# Patient Record
Sex: Female | Born: 1995 | Race: Black or African American | Hispanic: No | Marital: Single | Smoking: Former smoker
Health system: Southern US, Community
[De-identification: ages and names within clinical notes are randomized; demographics above are authoritative.]

## PROBLEM LIST (undated history)

## (undated) ENCOUNTER — Ambulatory Visit: Admission: EM | Payer: Medicaid Other

## (undated) DIAGNOSIS — A749 Chlamydial infection, unspecified: Secondary | ICD-10-CM

## (undated) DIAGNOSIS — S93409A Sprain of unspecified ligament of unspecified ankle, initial encounter: Secondary | ICD-10-CM

## (undated) DIAGNOSIS — B009 Herpesviral infection, unspecified: Secondary | ICD-10-CM

## (undated) DIAGNOSIS — A549 Gonococcal infection, unspecified: Secondary | ICD-10-CM

## (undated) HISTORY — PX: NO PAST SURGERIES: SHX2092

## (undated) HISTORY — DX: Chlamydial infection, unspecified: A74.9

## (undated) HISTORY — DX: Herpesviral infection, unspecified: B00.9

## (undated) HISTORY — DX: Sprain of unspecified ligament of unspecified ankle, initial encounter: S93.409A

## (undated) HISTORY — DX: Gonococcal infection, unspecified: A54.9

---
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
## 2016-03-01 ENCOUNTER — Other Ambulatory Visit: Payer: Self-pay | Admitting: Obstetrics and Gynecology

## 2016-03-02 DIAGNOSIS — A749 Chlamydial infection, unspecified: Secondary | ICD-10-CM

## 2016-03-02 HISTORY — DX: Chlamydial infection, unspecified: A74.9

## 2016-03-03 NOTE — Telephone Encounter (Signed)
She is supposed to come in for a pill check. Please schedule, will send in a one month script.

## 2016-03-03 NOTE — Telephone Encounter (Signed)
No VM set up. Will try later. 03/03/16 -sco

## 2016-03-03 NOTE — Telephone Encounter (Signed)
Patient scheduled for pill check up on 03/11/16. -sco

## 2016-03-03 NOTE — Telephone Encounter (Signed)
Medication refill request: Norethindrone Acetate-Ethinyl Last AEX:  12/09/15 BS Next AEX: 03/11/16 BS Last MMG (if hormonal medication request): n/a Refill authorized: 12/09/15 #1 3R. Please advise. Thank you.   Routing to JJ since BS is out of office today.

## 2016-03-11 ENCOUNTER — Encounter: Payer: Self-pay | Admitting: Obstetrics and Gynecology

## 2016-03-11 ENCOUNTER — Ambulatory Visit (INDEPENDENT_AMBULATORY_CARE_PROVIDER_SITE_OTHER): Payer: BLUE CROSS/BLUE SHIELD | Admitting: Obstetrics and Gynecology

## 2016-03-11 VITALS — BP 120/80 | HR 78 | Resp 14 | Ht 63.5 in | Wt 184.0 lb

## 2016-03-11 DIAGNOSIS — Z113 Encounter for screening for infections with a predominantly sexual mode of transmission: Secondary | ICD-10-CM

## 2016-03-11 DIAGNOSIS — N926 Irregular menstruation, unspecified: Secondary | ICD-10-CM

## 2016-03-11 DIAGNOSIS — Z3009 Encounter for other general counseling and advice on contraception: Secondary | ICD-10-CM | POA: Diagnosis not present

## 2016-03-11 LAB — POCT URINE PREGNANCY: PREG TEST UR: NEGATIVE

## 2016-03-11 LAB — HEPATITIS C ANTIBODY: HCV AB: NEGATIVE

## 2016-03-11 MED ORDER — NORETHINDRONE ACET-ETHINYL EST 1.5-30 MG-MCG PO TABS: 1.0000 | ORAL_TABLET | Freq: Every day | ORAL | 8 refills | Status: DC

## 2016-03-11 NOTE — Progress Notes (Signed)
GYNECOLOGY  VISIT   HPI: 21 y.o.   Single  African American  female   [id number] with No LMP recorded. Patient is not currently having periods (Reason: Oral contraceptives).   here for 3 month f/u of OCP's. Patient requests STD testing due to having a new partner.      Taking pills continuously since October 2017. Has taken some pills late, within an hour. Started bleeding 02/23/16 - and continuing until now.  Bleeds some days yes and some days no.  Spotting only. Some period cramping.  Happy with this overall.  Considered IUD.  Taking semester off.  Working at [employer].  UPT - neg.  GYNECOLOGIC HISTORY: No LMP recorded. Patient is not currently having periods (Reason: Oral contraceptives). Contraception:  OCP Menopausal hormone therapy:  None  Last mammogram:  None Last pap smear:  N/a         OB History    Gravida Para Term Preterm AB Living   0 0 0 0 0 0   SAB TAB Ectopic Multiple Live Births   0 0 0 0 0         There are no active problems to display for this patient.   Past Medical History:  Diagnosis Date  . Ankle sprain 08/2015    No past surgical history on file.  Current Outpatient Prescriptions  Medication Sig Dispense Refill  . JUNEL 1.5/30 1.5-30 MG-MCG tablet TAKE 1 TABLET BY MOUTH DAILY. 21 tablet 0   No current facility-administered medications for this visit.      ALLERGIES: Patient has no known allergies.  Family History  Problem Relation Age of Onset  . Hypertension Mother   . Breast cancer Paternal Grandmother     Social History   Social History  . Marital status: Single    Spouse name: N/A  . Number of children: N/A  . Years of education: N/A   Occupational History  . Not on file.   Social History Main Topics  . Smoking status: Never Smoker  . Smokeless tobacco: Never Used  . Alcohol use No  . Drug use: No  . Sexual activity: Yes     Comment: withdrawal    Other Topics Concern  . Not on file   Social History  Narrative  . No narrative on file    ROS:  Pertinent items are noted in HPI.  PHYSICAL EXAMINATION:    BP 120/80 (BP Location: Right Arm, Patient Position: Sitting, Cuff Size: Normal)   Pulse 78   Resp 14   Ht 5' 3.5" (1.613 m)   Wt 184 lb (83.5 kg)   BMI 32.08 kg/m     General appearance: alert, cooperative and appears stated age  Pelvic: External genitalia:  no lesions              Urethra:  normal appearing urethra with no masses, tenderness or lesions              Bartholins and Skenes: normal                 Vagina: normal appearing vagina with normal color and discharge, no lesions.  Light bleeding noted.              Cervix: no lesions                Bimanual Exam:  Uterus:  normal size, contour, position, consistency, mobility, non-tender  Adnexa: no mass, fullness, tenderness         Chaperone was present for exam.  ASSESSMENT  Contraception monitoring.  Irregular menses on continuous OCPs. Desire for STD screening.   PLAN  Will take combined OCPs now for only 21 days per month and be pill free for the "final week of the pack." If this does not normalize cycles, will consider a different OCP or NuvaRing.  We discussed IUDs briefly and she will not pursue this at this time. STD screening today including Affirm.  Discussed importance of condom use. Follow up for annual exam and prn.    An After Visit Summary was printed and given to the patient.  _15_____ minutes face to face time of which over 50% was spent in counseling.

## 2016-03-12 ENCOUNTER — Telehealth: Payer: Self-pay | Admitting: Obstetrics and Gynecology

## 2016-03-12 LAB — STD PANEL
HEP B S AG: NEGATIVE
HIV: NONREACTIVE

## 2016-03-12 LAB — GC/CHLAMYDIA PROBE AMP
CT Probe RNA: DETECTED — AB
GC PROBE AMP APTIMA: NOT DETECTED

## 2016-03-12 LAB — WET PREP BY MOLECULAR PROBE
Candida species: NEGATIVE
Gardnerella vaginalis: POSITIVE — AB
TRICHOMONAS VAG: NEGATIVE

## 2016-03-12 NOTE — Telephone Encounter (Signed)
Phone call attempt to patient by cell phone.  Voice mail not set up so unable to leave message.  I will release results to the patient through My Chart and ask her to return a call to the office for her treatment plan.  Patient had a positive chlamydia test and bacterial vaginosis on Affirm.  She will need azithromycin 1 gram orally and Flagyl 500 mg po bid for 7 days.   Partner(s) will need to be treated and patient will need to abstain for one week from intercourse.  ETOH precautions.   The chlamydia infection may also caused her abnormal vaginal bleeding profile.  Chlamydia infection will need to be reported the health dept.   She will need to return in 3 months.

## 2016-03-13 MED ORDER — METRONIDAZOLE 500 MG PO TABS: 500.0000 mg | ORAL_TABLET | Freq: Two times a day (BID) | ORAL | 0 refills | Status: DC

## 2016-03-13 MED ORDER — AZITHROMYCIN 1 G PO PACK: 1.0000 | PACK | Freq: Once | ORAL | 0 refills | Status: AC

## 2016-03-13 NOTE — Telephone Encounter (Signed)
Attempted to reach patient at number provided [phone], okay per ROI. There was no answer and recording states "The person you have reached has a voicemail box that has not been set up yet."

## 2016-03-13 NOTE — Telephone Encounter (Signed)
Spoke with patient. Advised of results and message as seen below from Dr.Silva. Patient is agreeable and verbalizes understanding. Aware she will need to abstain from intercourse until she and her partner have received treatment and for 1 week following. Will need to use condoms for protection against STD's with intercourse. Zithromax 1 gram take 1 packet once #1 0RF sent to pharmacy on file. Health department confidential communicable disease report completed and [facility] at [phone]. Flagyl 500 mg BID x 7 days #14 0RF sent to pharmacy on file. ETOH precautions given. 3 month recheck scheduled for 06/17/2016 at 9:30 am with Dr.Silva. Patient is agreeable to date and time.  Routing to provider for final review. Patient agreeable to disposition. Will close encounter.

## 2016-03-22 ENCOUNTER — Other Ambulatory Visit: Payer: Self-pay | Admitting: Obstetrics and Gynecology

## 2016-03-23 NOTE — Telephone Encounter (Signed)
Medication refill request: OCP Last AEX:  12/09/15 Dr. Edward JollySilva  Next AEX: 01/27/17  Last MMG (if hormonal medication request): None [facility]

## 2016-05-31 DIAGNOSIS — J019 Acute sinusitis, unspecified: Secondary | ICD-10-CM | POA: Diagnosis not present

## 2016-06-15 NOTE — Progress Notes (Signed)
GYNECOLOGY  VISIT   HPI: 21 y.o.   Single  African American  female   G0P0000 with Patient's last menstrual period was 04/08/2016.   here for 3 month follow up. Patient states that she has not had a menstrual cycle since the beginning of February. Would like UPT and to discuss other birth control options. Has not been consistent with OCP. Also needs GC/CT for Chlamydia test of cure.  Positive Chlamydia on 03/11/16 treated with Azithromycin.  Last birth control pill was 2 weeks ago.  Had a negative UPT at home.   Has a steady partner.  Midline cramping.  No bleeding.   GYNECOLOGIC HISTORY: Patient's last menstrual period was 04/08/2016. Contraception:  OCPs--Junel 1.5/30 Menopausal hormone therapy:  n/a Last mammogram:  n/a Last pap smear:   n/a        OB History    Gravida Para Term Preterm AB Living   0 0 0 0 0 0   SAB TAB Ectopic Multiple Live Births   0 0 0 0 0         There are no active problems to display for this patient.   Past Medical History:  Diagnosis Date  . Ankle sprain 08/2015  . Chlamydia 03/2016    History reviewed. No pertinent surgical history.  Current Outpatient Prescriptions  Medication Sig Dispense Refill  . Norethindrone Acetate-Ethinyl Estradiol (JUNEL 1.5/30) 1.5-30 MG-MCG tablet Take 1 tablet by mouth daily. 21 tablet 8   No current facility-administered medications for this visit.      ALLERGIES: Patient has no known allergies.  Family History  Problem Relation Age of Onset  . Hypertension Mother   . Breast cancer Paternal Grandmother     Social History   Social History  . Marital status: Single    Spouse name: N/A  . Number of children: N/A  . Years of education: N/A   Occupational History  . Not on file.   Social History Main Topics  . Smoking status: Never Smoker  . Smokeless tobacco: Never Used  . Alcohol use No  . Drug use: No  . Sexual activity: Yes    Birth control/ protection: Pill     Comment: withdrawal     Other Topics Concern  . Not on file   Social History Narrative  . No narrative on file    ROS:  Pertinent items are noted in HPI.  PHYSICAL EXAMINATION:    BP 124/66 (BP Location: Right Arm, Patient Position: Sitting, Cuff Size: Normal)   Pulse 84   Resp 16   Wt 189 lb (85.7 kg)   LMP 04/08/2016   BMI 32.95 kg/m     General appearance: alert, cooperative and appears stated age.  Tearful.   Pelvic: External genitalia:  no lesions              Urethra:  normal appearing urethra with no masses, tenderness or lesions              Bartholins and Skenes: normal                 Vagina: normal appearing vagina with normal color and discharge, no lesions              Cervix: no lesions                Bimanual Exam:  Uterus:   6 week size.              Adnexa: no  mass, fullness, tenderness             Chaperone was present for exam.  ASSESSMENT  Amenorrhea.  Early pregnancy. Hx chlamydia.   PLAN  GC/CT performed from urine.  Discussed early pregnancy. Start PNV.  Patient would like to return within the next week with her partner for an office visit.   An After Visit Summary was printed and given to the patient.  __15____ minutes face to face time of which over 50% was spent in counseling.

## 2016-06-17 ENCOUNTER — Ambulatory Visit (INDEPENDENT_AMBULATORY_CARE_PROVIDER_SITE_OTHER): Payer: BLUE CROSS/BLUE SHIELD | Admitting: Obstetrics and Gynecology

## 2016-06-17 ENCOUNTER — Encounter: Payer: Self-pay | Admitting: Obstetrics and Gynecology

## 2016-06-17 VITALS — BP 124/66 | HR 84 | Resp 16 | Wt 189.0 lb

## 2016-06-17 DIAGNOSIS — Z8619 Personal history of other infectious and parasitic diseases: Secondary | ICD-10-CM | POA: Diagnosis not present

## 2016-06-17 DIAGNOSIS — N912 Amenorrhea, unspecified: Secondary | ICD-10-CM | POA: Diagnosis not present

## 2016-06-17 LAB — POCT URINE PREGNANCY: Preg Test, Ur: POSITIVE — AB

## 2016-06-18 ENCOUNTER — Telehealth: Payer: Self-pay

## 2016-06-18 DIAGNOSIS — Z3201 Encounter for pregnancy test, result positive: Secondary | ICD-10-CM

## 2016-06-18 DIAGNOSIS — N912 Amenorrhea, unspecified: Secondary | ICD-10-CM

## 2016-06-18 LAB — GC/CHLAMYDIA PROBE AMP
CT Probe RNA: NOT DETECTED
GC PROBE AMP APTIMA: NOT DETECTED

## 2016-06-18 NOTE — Telephone Encounter (Signed)
-----   Message from Patton Salles, MD sent at 06/18/2016 10:09 AM EDT ----- Please report results to patient.  Follow up GC/CT testing is negative for infection.  Patient was treated for chlamydia earlier this year.   Patient was diagnosed with pregnancy yesterday at her recheck appointment, and she plans to return to discuss this more next week.  Her history of chlamydia does increase the risk of ectopic pregnancy, which is pregnancy outside the uterus.  This usually occurs in a fallopian tube if an ectopic were to occur.  Her uterus felt like it was already growing, so I think this is unlikely to be the case now.  I need her to report if she is having any vaginal spotting or pain beyond mild midline cramping.  I do recommend a pelvic ultrasound with me next week to confirm pregnancy status.  Please send to precert.  Cc - Claudette Laws

## 2016-06-19 ENCOUNTER — Telehealth: Payer: Self-pay | Admitting: Obstetrics and Gynecology

## 2016-06-19 NOTE — Telephone Encounter (Signed)
Spoke with patient. Advised of results as seen below from Dr.Silva. Reviewed risk of ectopic pregnancy with history of chlamydia. Advised of importance of follow up care and monitoring for any vaginal spotting or pain beyond mild midline cramping. Patient verbalizes understanding. OB transvaginal ultrasound scheduled for 06/25/2016 at 10 am with 10:30 am consult with Dr.Silva. Patient is agreeable to date and time. Order placed for precert.  Routing to provider for final review. Patient agreeable to disposition. Will close encounter.

## 2016-06-19 NOTE — Telephone Encounter (Signed)
Called patient to review benefits for a scheduled ultrasound. Unable to leave voicemail, due to voicemail not being set up.

## 2016-06-25 ENCOUNTER — Encounter: Payer: Self-pay | Admitting: Obstetrics and Gynecology

## 2016-06-25 ENCOUNTER — Ambulatory Visit (INDEPENDENT_AMBULATORY_CARE_PROVIDER_SITE_OTHER): Payer: BLUE CROSS/BLUE SHIELD | Admitting: Obstetrics and Gynecology

## 2016-06-25 ENCOUNTER — Ambulatory Visit: Payer: BLUE CROSS/BLUE SHIELD | Admitting: Obstetrics and Gynecology

## 2016-06-25 ENCOUNTER — Ambulatory Visit (INDEPENDENT_AMBULATORY_CARE_PROVIDER_SITE_OTHER): Payer: BLUE CROSS/BLUE SHIELD

## 2016-06-25 VITALS — BP 118/70 | HR 90 | Ht 63.0 in | Wt 182.6 lb

## 2016-06-25 DIAGNOSIS — N912 Amenorrhea, unspecified: Secondary | ICD-10-CM

## 2016-06-25 DIAGNOSIS — Z3490 Encounter for supervision of normal pregnancy, unspecified, unspecified trimester: Secondary | ICD-10-CM | POA: Diagnosis not present

## 2016-06-25 DIAGNOSIS — Z3201 Encounter for pregnancy test, result positive: Secondary | ICD-10-CM

## 2016-06-25 NOTE — Progress Notes (Signed)
Patient ID: Rebecca Adams, female   DOB: 1995-06-13, 21 y.o.   MRN: [id number] GYNECOLOGY  VISIT   HPI: 21 y.o.   Single  African American  female   G0P0000 with Patient's last menstrual period was 04/02/2016 (exact date).   here for Viability ultrasound.    Mother present with her today.   Patient is happy with the news of her pregnancy.  GYNECOLOGIC HISTORY: Patient's last menstrual period was 04/02/2016 (exact date). Contraception:  Menopausal hormone therapy: n/a Last mammogram:  n/a Last pap smear:   n/a        OB History    Gravida Para Term Preterm AB Living   0 0 0 0 0 0   SAB TAB Ectopic Multiple Live Births   0 0 0 0 0         There are no active problems to display for this patient.   Past Medical History:  Diagnosis Date  . Ankle sprain 08/2015  . Chlamydia 03/2016    No past surgical history on file.  Current Outpatient Prescriptions  Medication Sig Dispense Refill  . Prenatal Multivit-Min-Fe-FA (PRENATAL VITAMINS PO) Take 1 tablet by mouth daily.    . Norethindrone Acetate-Ethinyl Estradiol (JUNEL 1.5/30) 1.5-30 MG-MCG tablet Take 1 tablet by mouth daily. (Patient not taking: Reported on 06/25/2016) 21 tablet 8   No current facility-administered medications for this visit.      ALLERGIES: Patient has no known allergies.  Family History  Problem Relation Age of Onset  . Hypertension Mother   . Breast cancer Paternal Grandmother     Social History   Social History  . Marital status: Single    Spouse name: N/A  . Number of children: N/A  . Years of education: N/A   Occupational History  . Not on file.   Social History Main Topics  . Smoking status: Never Smoker  . Smokeless tobacco: Never Used  . Alcohol use No  . Drug use: No  . Sexual activity: Yes     Comment: withdrawal    Other Topics Concern  . Not on file   Social History Narrative  . No narrative on file    ROS:  Pertinent items are noted in HPI.  PHYSICAL EXAMINATION:     BP 118/70 (BP Location: Left Arm, Patient Position: Sitting, Cuff Size: Normal)   Pulse 90   Ht  (1.6 m)   Wt 182 lb 9.6 oz (82.8 kg)   LMP 04/02/2016 (Exact Date)   BMI 32.35 kg/m     General appearance: alert, cooperative and appears stated age  ASSESSMENT  Early viable IUP at 6 + 1 weeks. Right CL cyst.   PLAN  We discussed prenatal care and resources available at [facility]  Reading material reviewed, What to Expect When Expecting.  She will start a PNV.  We discussed avoiding exposures to unnecessary medications, tobacco, ETOH. She will establish care with an OB team.  List of providers given.    An After Visit Summary was printed and given to the patient.  __25____ minutes face to face time of which over 50% was spent in counseling.     Comment:  Vaginal U/S: anteverted uterus, single viable IUP, CRL c/w 6 wks 1 day - EDD 02/17/2017, FHR = 118 b/min - WNL for Gest age.  Cervix long and closed, adnexa WNL - small corpus luteum on right side. SHughes, RDMS

## 2016-06-26 NOTE — Telephone Encounter (Signed)
Ultrasound appointment was completed on 06/25/16. Ok to close encounter

## 2016-08-03 DIAGNOSIS — Z058 Observation and evaluation of newborn for other specified suspected condition ruled out: Secondary | ICD-10-CM | POA: Diagnosis not present

## 2016-09-18 DIAGNOSIS — L089 Local infection of the skin and subcutaneous tissue, unspecified: Secondary | ICD-10-CM | POA: Diagnosis not present

## 2016-09-18 DIAGNOSIS — W57XXXA Bitten or stung by nonvenomous insect and other nonvenomous arthropods, initial encounter: Secondary | ICD-10-CM | POA: Diagnosis not present

## 2016-12-15 ENCOUNTER — Other Ambulatory Visit: Payer: Self-pay | Admitting: Obstetrics and Gynecology

## 2016-12-15 NOTE — Telephone Encounter (Signed)
Medication refill request: OCP Last AEX: 12/09/15 Dr. Edward Jolly  Next AEX: 01/27/17  Last MMG (if hormonal medication request): none Refill authorized: 03/11/16 #[id number]./[id number]. Today please advise.

## 2016-12-15 NOTE — Telephone Encounter (Signed)
Please contact patient regarding her OCP request.  She was in early pregnancy at her last office visit.

## 2016-12-15 NOTE — Telephone Encounter (Signed)
Left voice mail to call back 

## 2016-12-16 NOTE — Telephone Encounter (Signed)
Second attempt to contact patient. Left voicemail to call back  

## 2016-12-17 NOTE — Telephone Encounter (Signed)
I am declining this prescription until we personally hear from the patient.

## 2017-01-27 ENCOUNTER — Ambulatory Visit: Payer: BLUE CROSS/BLUE SHIELD | Admitting: Obstetrics and Gynecology

## 2017-02-03 ENCOUNTER — Encounter: Payer: Self-pay | Admitting: Obstetrics and Gynecology

## 2017-02-03 ENCOUNTER — Other Ambulatory Visit: Payer: Self-pay

## 2017-02-03 ENCOUNTER — Ambulatory Visit: Payer: BLUE CROSS/BLUE SHIELD | Admitting: Obstetrics and Gynecology

## 2017-02-03 VITALS — BP 130/78 | HR 84 | Resp 18 | Ht 63.25 in | Wt 188.0 lb

## 2017-02-03 DIAGNOSIS — Z113 Encounter for screening for infections with a predominantly sexual mode of transmission: Secondary | ICD-10-CM | POA: Diagnosis not present

## 2017-02-03 DIAGNOSIS — Z23 Encounter for immunization: Secondary | ICD-10-CM | POA: Diagnosis not present

## 2017-02-03 DIAGNOSIS — Z01419 Encounter for gynecological examination (general) (routine) without abnormal findings: Secondary | ICD-10-CM | POA: Diagnosis not present

## 2017-02-03 DIAGNOSIS — F419 Anxiety disorder, unspecified: Secondary | ICD-10-CM

## 2017-02-03 MED ORDER — NORETHINDRONE ACET-ETHINYL EST 1.5-30 MG-MCG PO TABS: 1.0000 | ORAL_TABLET | Freq: Every day | ORAL | 11 refills | Status: DC

## 2017-02-03 NOTE — Patient Instructions (Signed)

## 2017-02-03 NOTE — Progress Notes (Signed)
Scheduled patient while in office to see Elon Jesterlay Atteberry with [facility] on 02/11/2017 at 2 pm. This is the first available appointment. Provided patient precautions to be seen with Behavioral Health or any ER if symptoms worsen. Provided telephone number for Suicide Prevention at [phone]. Patient verbalizes understanding.

## 2017-02-03 NOTE — Progress Notes (Signed)
21 y.o. G1P0010 Single African American female here for annual exam.    Had termination of pregnancy in July 2018.  Wants to start Depo Provera.  Using pills and hard to remember the pills.   Not sexually active since August 2018.  Same partner.   Has anxiety related to her termination of pregnancy. Puts her self down.  Some hallucinations - thinks people are talking about her.  Thinks about suicide but does not have a plan.   PCP: No PCP   Patient's last menstrual period was 01/09/2017 (approximate).           Sexually active: No.  The current method of family planning is OCP (estrogen/progesterone) and abstinence.    Exercising: No.  The patient does not participate in regular exercise at present. Smoker:  no  Health Maintenance: Pap:  N/A MMG:  n/a TDaP:  03-02-10 Gardasil: no HIV: 03-11-16 NR Hep C: 03-11-16 Neg Screening Labs: Not today   reports that  has never smoked. she has never used smokeless tobacco. She reports that she uses drugs. Drug: Marijuana. She reports that she does not drink alcohol.  Past Medical History:  Diagnosis Date  . Ankle sprain 08/2015  . Chlamydia 03/2016    History reviewed. No pertinent surgical history.  Current Outpatient Medications  Medication Sig Dispense Refill  . Norethindrone Acetate-Ethinyl Estradiol (JUNEL 1.5/30) 1.5-30 MG-MCG tablet Take 1 tablet by mouth daily. 21 tablet 8   No current facility-administered medications for this visit.     Family History  Problem Relation Age of Onset  . Hypertension Mother   . Breast cancer Paternal Grandmother     ROS:  Pertinent items are noted in HPI.  Otherwise, a comprehensive ROS was negative.  Exam:   BP 130/78 (BP Location: Right Arm, Patient Position: Sitting, Cuff Size: Large)   Pulse 84   Resp 18   Ht 5' 3.25" (1.607 m)   Wt 188 lb (85.3 kg)   LMP 01/09/2017 (Approximate)   BMI 33.04 kg/m     General appearance: alert, cooperative and appears stated age Head:  Normocephalic, without obvious abnormality, atraumatic Neck: no adenopathy, supple, symmetrical, trachea midline and thyroid normal to inspection and palpation Lungs: clear to auscultation bilaterally Breasts: normal appearance, no masses or tenderness, No nipple retraction or dimpling, No nipple discharge or bleeding, No axillary or supraclavicular adenopathy Heart: regular rate and rhythm Abdomen: soft, non-tender; no masses, no organomegaly Extremities: extremities normal, atraumatic, no cyanosis or edema Skin: Skin color, texture, turgor normal. No rashes or lesions Lymph nodes: Cervical, supraclavicular, and axillary nodes normal. No abnormal inguinal nodes palpated Neurologic: Grossly normal  Pelvic: External genitalia:  no lesions              Urethra:  normal appearing urethra with no masses, tenderness or lesions              Bartholins and Skenes: normal                 Vagina: normal appearing vagina with normal color and discharge, no lesions              Cervix: no lesions              Pap taken: No. Bimanual Exam:  Uterus:  normal size, contour, position, consistency, mobility, non-tender              Adnexa: no mass, fullness, tenderness  Rectal exam: Yes.  .  Confirms.  Firm stool in rectum.               Anus:  normal sphincter tone, no lesions  Chaperone was present for exam.  Assessment:   Well woman visit with normal exam. Recent VIP. Anxiety.  Suicidal ideation.   Plan: Mammogram screening at age 21. Recommended self breast awareness. Pap and HR HPV as above. Guidelines for Calcium, Vitamin D, regular exercise program including cardiovascular and weight bearing exercise. Refill of OCPs.  She will set an alarm on her cell phone.  Will consider Depo for later.  I do not recommend this until after she is stable with her mental health. Declines NuvaRing, Ortho Evra, Nexplanon and IUDs. STD screening.  Referral to Psychiatry.  Gardasil vaccine.   Follow up annually and prn.    After visit summary provided.

## 2017-02-04 LAB — HEP, RPR, HIV PANEL
HIV Screen 4th Generation wRfx: NONREACTIVE
Hepatitis B Surface Ag: NEGATIVE
RPR Ser Ql: NONREACTIVE

## 2017-02-04 LAB — HEPATITIS C ANTIBODY: Hep C Virus Ab: 0.1 s/co ratio (ref 0.0–0.9)

## 2017-02-10 LAB — CHLAMYDIA/GONOCOCCUS/TRICHOMONAS, NAA: Trich vag by NAA: NEGATIVE

## 2017-02-11 DIAGNOSIS — F321 Major depressive disorder, single episode, moderate: Secondary | ICD-10-CM | POA: Diagnosis not present

## 2017-02-12 ENCOUNTER — Other Ambulatory Visit (INDEPENDENT_AMBULATORY_CARE_PROVIDER_SITE_OTHER): Payer: BLUE CROSS/BLUE SHIELD

## 2017-02-12 DIAGNOSIS — Z8619 Personal history of other infectious and parasitic diseases: Secondary | ICD-10-CM

## 2017-02-14 LAB — GC/CHLAMYDIA PROBE AMP
CHLAMYDIA, DNA PROBE: NEGATIVE
NEISSERIA GONORRHOEAE BY PCR: NEGATIVE

## 2017-02-18 DIAGNOSIS — F321 Major depressive disorder, single episode, moderate: Secondary | ICD-10-CM | POA: Diagnosis not present

## 2017-04-06 ENCOUNTER — Telehealth: Payer: Self-pay | Admitting: Obstetrics and Gynecology

## 2017-04-06 ENCOUNTER — Ambulatory Visit: Payer: BLUE CROSS/BLUE SHIELD

## 2017-04-06 NOTE — Telephone Encounter (Signed)
Left message regarding missed gardisil appointment.

## 2017-04-07 ENCOUNTER — Ambulatory Visit (INDEPENDENT_AMBULATORY_CARE_PROVIDER_SITE_OTHER): Payer: BLUE CROSS/BLUE SHIELD

## 2017-04-07 VITALS — BP 110/68 | HR 80 | Ht 63.5 in | Wt 188.0 lb

## 2017-04-07 DIAGNOSIS — Z23 Encounter for immunization: Secondary | ICD-10-CM | POA: Diagnosis not present

## 2017-04-07 NOTE — Telephone Encounter (Signed)
Thank you for the update!

## 2017-04-30 DIAGNOSIS — A549 Gonococcal infection, unspecified: Secondary | ICD-10-CM

## 2017-04-30 HISTORY — DX: Gonococcal infection, unspecified: A54.9

## 2017-05-25 ENCOUNTER — Encounter: Payer: Self-pay | Admitting: Obstetrics and Gynecology

## 2017-05-25 ENCOUNTER — Other Ambulatory Visit: Payer: Self-pay

## 2017-05-25 ENCOUNTER — Other Ambulatory Visit (HOSPITAL_COMMUNITY)
Admission: RE | Admit: 2017-05-25 | Discharge: 2017-05-25 | Disposition: A | Discharge status: Home / Self Care | Payer: Self-pay | Source: Ambulatory Visit | Attending: Obstetrics and Gynecology | Admitting: Obstetrics and Gynecology

## 2017-05-25 ENCOUNTER — Ambulatory Visit: Payer: BLUE CROSS/BLUE SHIELD | Admitting: Obstetrics and Gynecology

## 2017-05-25 VITALS — BP 132/74 | HR 76 | Resp 14 | Ht 62.5 in | Wt 188.8 lb

## 2017-05-25 DIAGNOSIS — Z113 Encounter for screening for infections with a predominantly sexual mode of transmission: Secondary | ICD-10-CM | POA: Diagnosis not present

## 2017-05-25 DIAGNOSIS — Z3009 Encounter for other general counseling and advice on contraception: Secondary | ICD-10-CM | POA: Diagnosis not present

## 2017-05-25 DIAGNOSIS — B9689 Other specified bacterial agents as the cause of diseases classified elsewhere: Secondary | ICD-10-CM | POA: Insufficient documentation

## 2017-05-25 NOTE — Patient Instructions (Signed)

## 2017-05-25 NOTE — Progress Notes (Signed)
GYNECOLOGY  VISIT   HPI: 22 y.o.   Single  African American  female   G1P0010 with Patient's last menstrual period was 05/16/2017.   here to discuss birth control options and wants STD testing.  Interested in Depo Provera.  [facility].  Feeling better.  Tried Zoloft but stopped as her symptoms worsened. Gabapentin for anxiety attacks as needed.   New sexual partner starting last month.  Using condoms occasionally.   GYNECOLOGIC HISTORY: Patient's last menstrual period was 05/16/2017. Contraception: condoms Menopausal hormone therapy:  n/a Last mammogram:  n/a Last pap smear:   n/a        OB History    Gravida  1   Para  0   Term  0   Preterm  0   AB  1   Living  0     SAB  0   TAB  1   Ectopic  0   Multiple  0   Live Births  0              There are no active problems to display for this patient.   Past Medical History:  Diagnosis Date  . Ankle sprain 08/2015  . Chlamydia 03/2016    History reviewed. No pertinent surgical history.  Current Outpatient Medications  Medication Sig Dispense Refill  . gabapentin (NEURONTIN) 100 MG capsule Take 100 mg by mouth as needed.     No current facility-administered medications for this visit.      ALLERGIES: Patient has no known allergies.  Family History  Problem Relation Age of Onset  . Hypertension Mother   . Breast cancer Paternal Grandmother     Social History   Socioeconomic History  . Marital status: Single    Spouse name: Not on file  . Number of children: Not on file  . Years of education: Not on file  . Highest education level: Not on file  Occupational History  . Not on file  Social Needs  . Financial resource strain: Not on file  . Food insecurity:    Worry: Not on file    Inability: Not on file  . Transportation needs:    Medical: Not on file    Non-medical: Not on file  Tobacco Use  . Smoking status: Never Smoker  . Smokeless tobacco: Never Used   Substance and Sexual Activity  . Alcohol use: No  . Drug use: Yes    Types: Marijuana  . Sexual activity: Yes    Birth control/protection: Condom  Lifestyle  . Physical activity:    Days per week: Not on file    Minutes per session: Not on file  . Stress: Not on file  Relationships  . Social connections:    Talks on phone: Not on file    Gets together: Not on file    Attends religious service: Not on file    Active member of club or organization: Not on file    Attends meetings of clubs or organizations: Not on file    Relationship status: Not on file  . Intimate partner violence:    Fear of current or ex partner: Not on file    Emotionally abused: Not on file    Physically abused: Not on file    Forced sexual activity: Not on file  Other Topics Concern  . Not on file  Social History Narrative  . Not on file    ROS:  Pertinent items are  noted in HPI.  PHYSICAL EXAMINATION:    BP 132/74 (BP Location: Right Arm, Patient Position: Sitting, Cuff Size: Normal)   Pulse 76   Ht 5' 2.5" (1.588 m)   Wt 188 lb 12.8 oz (85.6 kg)   LMP 05/16/2017   BMI 33.98 kg/m     General appearance: alert, cooperative and appears stated age  Pelvic: External genitalia:  no lesions              Urethra:  normal appearing urethra with no masses, tenderness or lesions              Bartholins and Skenes: normal                 Vagina: normal appearing vagina with normal color and discharge, no lesions              Cervix: no lesions                Bimanual Exam:  Uterus:  normal size, contour, position, consistency, mobility, non-tender              Adnexa: no mass, fullness, tenderness              Chaperone was present for exam.  ASSESSMENT  Contraceptive counseling.  STD screening  PLAN  We reviewed options for contraception - pills, ring, patch, nexplanon, IUDs, Depo Provera.  She chooses Depo Provera.  We did discuss potential for depression with Depo Provera, although this  is not a contraindication for use.  Will do Depo Provera 150 mg IM q 3 months until annual exam is due.  She will call with her next cycle to start this.  Use condoms and spermicide.  STD screening.    An After Visit Summary was printed and given to the patient.  ___15___ minutes face to face time of which over 50% was spent in counseling.

## 2017-05-26 LAB — HEP, RPR, HIV PANEL
HIV SCREEN 4TH GENERATION: NONREACTIVE
Hepatitis B Surface Ag: NEGATIVE
RPR: NONREACTIVE

## 2017-05-26 LAB — CERVICOVAGINAL ANCILLARY ONLY
CHLAMYDIA, DNA PROBE: NEGATIVE
NEISSERIA GONORRHEA: POSITIVE — AB
Trichomonas: NEGATIVE

## 2017-05-26 LAB — HEPATITIS C ANTIBODY: Hep C Virus Ab: <0.1 s/co ratio (ref 0.0–0.9)

## 2017-05-27 ENCOUNTER — Encounter: Payer: Self-pay | Admitting: Obstetrics and Gynecology

## 2017-05-27 ENCOUNTER — Telehealth: Payer: Self-pay | Admitting: Obstetrics and Gynecology

## 2017-05-27 MED ORDER — AZITHROMYCIN 250 MG PO TABS: ORAL_TABLET | ORAL | 0 refills | Status: DC

## 2017-05-27 NOTE — Telephone Encounter (Signed)
Phone call after hours to discuss positive gonorrhea test.  Other STD testing is negative for chlamydia, HIV, syphilis, trichomonas, and hep B and C.  Patient will come to office tomorrow for Ceftriaxone 250 mg IM x 1.  She will bring Azithromycin 250 mg tablets to the office with her to take under nursing supervision.  Take 4 tablet all at once (1 gram).  I have sent this to her pharmacy tonight.   She will inform her partner who will see his provider for evaluation and treatment.   She will abstain for one week after her partner is treated.   She understands there is risk of ectopic pregnancy due to her history of chlamydia and now gonorrhea.   Staff will report gonorrhea to health department.    She will need a recheck appointment in 3 months.

## 2017-05-28 ENCOUNTER — Ambulatory Visit (INDEPENDENT_AMBULATORY_CARE_PROVIDER_SITE_OTHER): Payer: BLUE CROSS/BLUE SHIELD

## 2017-05-28 ENCOUNTER — Ambulatory Visit: Payer: Self-pay

## 2017-05-28 VITALS — BP 110/74 | HR 80 | Ht 63.5 in | Wt 186.0 lb

## 2017-05-28 DIAGNOSIS — A549 Gonococcal infection, unspecified: Secondary | ICD-10-CM

## 2017-05-28 MED ORDER — CEFTRIAXONE SODIUM 250 MG IJ SOLR
250.0000 mg | Freq: Once | INTRAMUSCULAR | Status: AC
  Administered 2017-05-28: 250 mg via INTRAMUSCULAR

## 2017-05-28 MED ORDER — AZITHROMYCIN 250 MG PO TABS: ORAL_TABLET | ORAL | 0 refills | Status: DC

## 2017-05-28 NOTE — Telephone Encounter (Signed)
Confidential [facility] at [phone], ATTN; Cheryln ManlyBrandy Sessoms.   Routing to provider for final review. Patient is agreeable to disposition. Will close encounter.

## 2017-05-28 NOTE — Progress Notes (Signed)
Patient came into for injection of Ceftriaxone 250mg   IM diluted with 0.719ml of 1% Lidocaine--Lot #[id number], Exp 1/23. She brought in her Rx for Azithromycin 250mg  #4 tablets and she took those all at once under my care. Patient tolerated injection and oral medication well. She has 3 month TOC and 3rd Gardasil vaccine scheduled in June.   Had patient stay in office for 12 minutes after injection was given.

## 2017-05-28 NOTE — Telephone Encounter (Signed)
Spoke with patient, scheduled for nurse visit for today at 10am. Patient scheduled for 3 month recheck on 08/27/17 at 10:30am. Patient was previously scheduled for 3rd gardasil on 6/5, this visit was cancelled, will received at 3 month f/u appt. Patient has picked up azithromycin, will bring with her to nurse visit. Patient verbalizes understanding.

## 2017-05-28 NOTE — Telephone Encounter (Signed)
Spoke with patient. Patient states she did not pick up Rx previously, [facility], no medication on file. Advised patient azithromycin went to [facility]. Advised will send new Rx to [facility] on file, will need to change nurse visit appt scheduled for 10am, rescheduled to 10:45am today. Advised patient to return call to office if any further assistance needed.    Call placed to [facility], spoke with Sopana, RX for azithromycin sent on 05/28/17 cancelled.

## 2017-06-16 ENCOUNTER — Ambulatory Visit (INDEPENDENT_AMBULATORY_CARE_PROVIDER_SITE_OTHER): Payer: BLUE CROSS/BLUE SHIELD

## 2017-06-16 ENCOUNTER — Telehealth: Payer: Self-pay | Admitting: Obstetrics and Gynecology

## 2017-06-16 VITALS — BP 122/76 | HR 70 | Ht 63.5 in | Wt 190.6 lb

## 2017-06-16 DIAGNOSIS — Z30013 Encounter for initial prescription of injectable contraceptive: Secondary | ICD-10-CM

## 2017-06-16 MED ORDER — MEDROXYPROGESTERONE ACETATE 150 MG/ML IM SUSP
150.0000 mg | Freq: Once | INTRAMUSCULAR | Status: AC
  Administered 2017-06-16: 150 mg via INTRAMUSCULAR

## 2017-06-16 NOTE — Progress Notes (Signed)
Patient is here for Depo Provera Injection Patient is within Depo Provera Calender Limits Yes, first injection Next Depo Due between: 7/3 - 09/15/17 Last AEX: 02-03-17 AEX Scheduled: 02-16-18  Patient is aware when next depo is due  Pt tolerated Injection well in RUOQ  Routed to provider for review, encounter closed.

## 2017-06-16 NOTE — Telephone Encounter (Signed)
VOID

## 2017-07-16 ENCOUNTER — Telehealth: Payer: Self-pay | Admitting: Obstetrics and Gynecology

## 2017-07-16 ENCOUNTER — Encounter: Payer: Self-pay | Admitting: Obstetrics and Gynecology

## 2017-07-16 ENCOUNTER — Ambulatory Visit (INDEPENDENT_AMBULATORY_CARE_PROVIDER_SITE_OTHER): Payer: BLUE CROSS/BLUE SHIELD | Admitting: Obstetrics and Gynecology

## 2017-07-16 ENCOUNTER — Ambulatory Visit: Payer: BLUE CROSS/BLUE SHIELD | Admitting: Urgent Care

## 2017-07-16 ENCOUNTER — Other Ambulatory Visit (HOSPITAL_COMMUNITY)
Admission: RE | Admit: 2017-07-16 | Discharge: 2017-07-16 | Disposition: A | Discharge status: Home / Self Care | Payer: BLUE CROSS/BLUE SHIELD | Source: Ambulatory Visit | Attending: Obstetrics and Gynecology | Admitting: Obstetrics and Gynecology

## 2017-07-16 VITALS — BP 120/62 | HR 72 | Temp 101.4°F | Resp 16 | Ht 62.5 in | Wt 182.0 lb

## 2017-07-16 DIAGNOSIS — R319 Hematuria, unspecified: Secondary | ICD-10-CM | POA: Diagnosis not present

## 2017-07-16 DIAGNOSIS — Z113 Encounter for screening for infections with a predominantly sexual mode of transmission: Secondary | ICD-10-CM

## 2017-07-16 DIAGNOSIS — N390 Urinary tract infection, site not specified: Secondary | ICD-10-CM | POA: Insufficient documentation

## 2017-07-16 LAB — CBC WITH DIFFERENTIAL/PLATELET
BASOS: 0 %
Basophils Absolute: 0 10*3/uL (ref 0.0–0.2)
EOS (ABSOLUTE): 0 10*3/uL (ref 0.0–0.4)
EOS: 0 %
Hematocrit: 37.5 % (ref 34.0–46.6)
Hemoglobin: 12.9 g/dL (ref 11.1–15.9)
LYMPHS ABS: 0.8 10*3/uL (ref 0.7–3.1)
Lymphs: 4 %
MCH: 31.9 pg (ref 26.6–33.0)
MCHC: 34.4 g/dL (ref 31.5–35.7)
MCV: 93 fL (ref 79–97)
Monocytes Absolute: 2 10*3/uL — ABNORMAL HIGH (ref 0.1–0.9)
Monocytes: 9 %
NEUTROS ABS: 18.4 10*3/uL — AB (ref 1.4–7.0)
Neutrophils: 87 %
PLATELETS: 203 10*3/uL (ref 150–379)
RBC: 4.05 x10E6/uL (ref 3.77–5.28)
RDW: 12.8 % (ref 12.3–15.4)
WBC: 21.1 10*3/uL (ref 3.4–10.8)

## 2017-07-16 LAB — BASIC METABOLIC PANEL
BUN/Creatinine Ratio: 8 — ABNORMAL LOW (ref 9–23)
BUN: 8 mg/dL (ref 6–20)
CALCIUM: 9.4 mg/dL (ref 8.7–10.2)
CO2: 23 mmol/L (ref 20–29)
CREATININE: 1 mg/dL (ref 0.57–1.00)
Chloride: 102 mmol/L (ref 96–106)
GFR calc Af Amer: 94 mL/min/{1.73_m2} (ref >59)
GFR, EST NON AFRICAN AMERICAN: 81 mL/min/{1.73_m2} (ref >59)
GLUCOSE: 99 mg/dL (ref 65–99)
Potassium: 3.6 mmol/L (ref 3.5–5.2)
Sodium: 134 mmol/L (ref 134–144)

## 2017-07-16 LAB — POCT URINALYSIS DIPSTICK
BILIRUBIN UA: NEGATIVE
GLUCOSE UA: NEGATIVE
KETONES UA: NEGATIVE
Nitrite, UA: POSITIVE
Protein, UA: NEGATIVE
Urobilinogen, UA: NEGATIVE E.U./dL — AB
pH, UA: 5 (ref 5.0–8.0)

## 2017-07-16 MED ORDER — ONDANSETRON HCL 4 MG PO TABS: 4.0000 mg | ORAL_TABLET | Freq: Three times a day (TID) | ORAL | 0 refills | Status: DC | PRN

## 2017-07-16 MED ORDER — CIPROFLOXACIN HCL 500 MG PO TABS: 500.0000 mg | ORAL_TABLET | Freq: Two times a day (BID) | ORAL | 0 refills | Status: DC

## 2017-07-16 MED ORDER — CEFTRIAXONE SODIUM 250 MG IJ SOLR
250.0000 mg | Freq: Once | INTRAMUSCULAR | Status: AC
  Administered 2017-07-16: 250 mg via INTRAMUSCULAR

## 2017-07-16 NOTE — Telephone Encounter (Signed)
Patient believe she may have a UTI and would like to be fit in to be seen today.

## 2017-07-16 NOTE — Telephone Encounter (Signed)
Message left to return call to Emily at [phone].    

## 2017-07-16 NOTE — Progress Notes (Signed)
GYNECOLOGY  VISIT   HPI: 22 y.o.   Single  African American  female   G1P0010 with No LMP recorded.   here for UTI -- symptoms started Saturday.  Symptoms started as urinary frequency and painful urination.  Low back ache. Nausea and vomiting.  Having fever and chills.   Some light headedness.   Last intercourse was 3 days ago.  Not painful.  Treated for gonorrhea on 05/28/17.  Received Ceftriaxone 250 mg IM and Azithromycin 1 gram orally.   Urine dip: 2+ WBC, 2+ RBC, Positive Nitrite, Cloudy, Odor    GYNECOLOGIC HISTORY: No LMP recorded. Contraception:  Depo-Provera Menopausal hormone therapy:  n/a Last mammogram:  n/a Last pap smear:   n/a        OB History    Gravida  1   Para  0   Term  0   Preterm  0   AB  1   Living  0     SAB  0   TAB  1   Ectopic  0   Multiple  0   Live Births  0              There are no active problems to display for this patient.   Past Medical History:  Diagnosis Date  . Ankle sprain 08/2015  . Chlamydia 03/2016  . Gonorrhea 04/2017    History reviewed. No pertinent surgical history.  Current Outpatient Medications  Medication Sig Dispense Refill  . gabapentin (NEURONTIN) 100 MG capsule Take 100 mg by mouth as needed.    . medroxyPROGESTERone (DEPO-PROVERA) 150 MG/ML injection Inject 150 mg into the muscle every 3 (three) months.    . ciprofloxacin (CIPRO) 500 MG tablet Take 1 tablet (500 mg total) by mouth 2 (two) times daily. 14 tablet 0  . ondansetron (ZOFRAN) 4 MG tablet Take 1 tablet (4 mg total) by mouth every 8 (eight) hours as needed for nausea or vomiting. 20 tablet 0   No current facility-administered medications for this visit.      ALLERGIES: Patient has no known allergies.  Family History  Problem Relation Age of Onset  . Hypertension Mother   . Breast cancer Paternal Grandmother     Social History   Socioeconomic History  . Marital status: Single    Spouse name: Not on file  . Number  of children: Not on file  . Years of education: Not on file  . Highest education level: Not on file  Occupational History  . Not on file  Social Needs  . Financial resource strain: Not on file  . Food insecurity:    Worry: Not on file    Inability: Not on file  . Transportation needs:    Medical: Not on file    Non-medical: Not on file  Tobacco Use  . Smoking status: Never Smoker  . Smokeless tobacco: Never Used  Substance and Sexual Activity  . Alcohol use: No  . Drug use: Not Currently  . Sexual activity: Yes    Birth control/protection: Injection  Lifestyle  . Physical activity:    Days per week: Not on file    Minutes per session: Not on file  . Stress: Not on file  Relationships  . Social connections:    Talks on phone: Not on file    Gets together: Not on file    Attends religious service: Not on file    Active member of club or organization: Not on file  Attends meetings of clubs or organizations: Not on file    Relationship status: Not on file  . Intimate partner violence:    Fear of current or ex partner: Not on file    Emotionally abused: Not on file    Physically abused: Not on file    Forced sexual activity: Not on file  Other Topics Concern  . Not on file  Social History Narrative  . Not on file    Review of Systems  Constitutional: Positive for chills and fever.  HENT: Negative.   Eyes: Negative.   Respiratory: Negative.   Cardiovascular: Negative.   Gastrointestinal: Positive for abdominal pain, nausea and vomiting.       Bloating   Endocrine: Negative.   Genitourinary: Positive for frequency and urgency.       Burning and some pain with urination  Musculoskeletal: Positive for back pain.  Skin: Negative.   Allergic/Immunologic: Negative.   Neurological: Negative.   Hematological: Negative.   Psychiatric/Behavioral: Negative.     PHYSICAL EXAMINATION:    BP 120/62   Pulse 72   Temp (!) 101.4 F (38.6 C) (Oral)   Resp 16   Ht 5'  2.5" (1.588 m)   Wt 182 lb (82.6 kg)   BMI 32.76 kg/m     General appearance: alert, cooperative and appears stated age.  Warm to the touch.  Looks tired.  Head: Normocephalic, without obvious abnormality, atraumatic Neck: no adenopathy, supple, symmetrical, trachea midline and thyroid normal to inspection and palpation Lungs: clear to auscultation bilaterally Heart: tachycardia.  Abdomen: soft,  Mildly tender in lower abdomen with no guarding or rebound, no masses,  no organomegaly. Back:  No CVA tenderness.  Extremities: extremities normal, atraumatic, no cyanosis or edema Skin: Skin color, texture, turgor normal. No rashes or lesions Lymph nodes: Cervical, supraclavicular nodes normal. No abnormal inguinal nodes palpated Neurologic: Grossly normal  Pelvic: External genitalia:  no lesions              Urethra:  normal appearing urethra with no masses, tenderness or lesions              Bartholins and Skenes: normal                 Vagina: normal appearing vagina with normal color and discharge, no lesions              Cervix: no lesions.  No CMT.                 Bimanual Exam:  Uterus:  normal size, contour, position, consistency, mobility, non-tender              Adnexa: no mass, fullness, tenderness              Rectal exam: Yes.  .  Confirms.              Anus:  normal sphincter tone, no lesions  Chaperone was present for exam.  ASSESSMENT  Complicated UTI.  No evidence of PID.  Recent gonorrhea.   PLAN  CBC with differential, BMP.  STAT. Urine micro and culture. GC/CT/trich from thin prep.  Ceftriaxone 250 mg IM now.  Cipro 500 mg po bid x 7 days.  Zofran 4 mg po q 8 hours prn.  Hydrate well.  No work this weekend.  Recheck in 3 days.  To ER or urgent care during weekend if worsens.    An After Visit Summary was printed and given to  the patient.  __25____ minutes face to face time of which over 50% was spent in counseling.

## 2017-07-16 NOTE — Telephone Encounter (Signed)
Returned call to patient. After review with Nursing Supervisor, patient will come to office now for work in appointment. Patient states she lives ~15 minutes away and aware appointment will be a work in. Patient agreeable. Patient scheduled at 1115 per Billie Ruddy, RN.   Routing to provider for final review. Patient agreeable to disposition. Will close encounter.

## 2017-07-16 NOTE — Telephone Encounter (Signed)
Patient returned call. Patient states she has had burning with urination since Monday, but has been "procrastinating" with calling for appointment. Patient states yesterday she started having lower back pain, stomach pain, feeling light headed and vomiting. Patient states she doesn't own a thermometer to take her temperature, but she has felt feverish. Patient states she has been taking cystex from [facility] which slightly improved the burning with urination. Patient requesting appointment to be evaluated. RN advised would need to review schedule with Dr. Edward Jolly and return call. Patient agreeable.

## 2017-07-17 LAB — URINALYSIS, MICROSCOPIC ONLY
Casts: NONE SEEN /lpf
WBC, UA: >30 /hpf — AB (ref 0–5)

## 2017-07-18 LAB — URINE CULTURE

## 2017-07-19 ENCOUNTER — Encounter: Payer: Self-pay | Admitting: Obstetrics and Gynecology

## 2017-07-19 ENCOUNTER — Telehealth: Payer: Self-pay | Admitting: Obstetrics and Gynecology

## 2017-07-19 ENCOUNTER — Other Ambulatory Visit: Payer: Self-pay

## 2017-07-19 ENCOUNTER — Ambulatory Visit: Payer: BLUE CROSS/BLUE SHIELD | Admitting: Obstetrics and Gynecology

## 2017-07-19 VITALS — BP 108/60 | HR 94 | Temp 98.1°F | Resp 16 | Ht 63.5 in | Wt 182.4 lb

## 2017-07-19 DIAGNOSIS — N390 Urinary tract infection, site not specified: Secondary | ICD-10-CM | POA: Diagnosis not present

## 2017-07-19 LAB — CERVICOVAGINAL ANCILLARY ONLY
Chlamydia: NEGATIVE
Neisseria Gonorrhea: NEGATIVE
Trichomonas: NEGATIVE

## 2017-07-19 NOTE — Telephone Encounter (Signed)
Patient was seen this morning in follow up.  Thank you!

## 2017-07-19 NOTE — Telephone Encounter (Signed)
Patient did not keep her appointment for a 3 day recheck with Dr. Edward Jolly today. I called her and left a message to call our office back and reschedule this missed appointment. Routing to provider for FYI.

## 2017-07-19 NOTE — Progress Notes (Signed)
GYNECOLOGY  VISIT   HPI: 22 y.o.   Single  African American  female   G1P0010 with No LMP recorded. Patient has had an injection.   here for 3 day recheck .  Patient states she feels much better.  Lower abdominal and back pain improved.  Much less discomfort with urination.   No more fever.  Taking Zofran tid and taking Ciprofloxacin.   UC showed E Coli sensitive to Ceftriaxone and Ciprofloxacin.  WBC was 21,000 3 days ago.  GYNECOLOGIC HISTORY: No LMP recorded. Patient has had an injection. Contraception:  Depo-Provera Menopausal hormone therapy:  n/a Last mammogram:  n/a Last pap smear:   n/a        OB History    Gravida  1   Para  0   Term  0   Preterm  0   AB  1   Living  0     SAB  0   TAB  1   Ectopic  0   Multiple  0   Live Births  0              There are no active problems to display for this patient.   Past Medical History:  Diagnosis Date  . Ankle sprain 08/2015  . Chlamydia 03/2016  . Gonorrhea 04/2017    History reviewed. No pertinent surgical history.  Current Outpatient Medications  Medication Sig Dispense Refill  . ciprofloxacin (CIPRO) 500 MG tablet Take 1 tablet (500 mg total) by mouth 2 (two) times daily. 14 tablet 0  . gabapentin (NEURONTIN) 100 MG capsule Take 100 mg by mouth as needed.    . medroxyPROGESTERone (DEPO-PROVERA) 150 MG/ML injection Inject 150 mg into the muscle every 3 (three) months.    . ondansetron (ZOFRAN) 4 MG tablet Take 1 tablet (4 mg total) by mouth every 8 (eight) hours as needed for nausea or vomiting. 20 tablet 0   No current facility-administered medications for this visit.      ALLERGIES: Patient has no known allergies.  Family History  Problem Relation Age of Onset  . Hypertension Mother   . Breast cancer Paternal Grandmother     Social History   Socioeconomic History  . Marital status: Single    Spouse name: Not on file  . Number of children: Not on file  . Years of education:  Not on file  . Highest education level: Not on file  Occupational History  . Not on file  Social Needs  . Financial resource strain: Not on file  . Food insecurity:    Worry: Not on file    Inability: Not on file  . Transportation needs:    Medical: Not on file    Non-medical: Not on file  Tobacco Use  . Smoking status: Never Smoker  . Smokeless tobacco: Never Used  Substance and Sexual Activity  . Alcohol use: No  . Drug use: Not Currently  . Sexual activity: Yes    Birth control/protection: Injection  Lifestyle  . Physical activity:    Days per week: Not on file    Minutes per session: Not on file  . Stress: Not on file  Relationships  . Social connections:    Talks on phone: Not on file    Gets together: Not on file    Attends religious service: Not on file    Active member of club or organization: Not on file    Attends meetings of clubs or organizations: Not on  file    Relationship status: Not on file  . Intimate partner violence:    Fear of current or ex partner: Not on file    Emotionally abused: Not on file    Physically abused: Not on file    Forced sexual activity: Not on file  Other Topics Concern  . Not on file  Social History Narrative  . Not on file    Review of Systems  Constitutional: Negative.   HENT: Negative.   Eyes: Negative.   Respiratory: Negative.   Cardiovascular: Negative.   Gastrointestinal: Positive for constipation.  Endocrine: Negative.   Genitourinary: Negative.   Musculoskeletal: Positive for back pain.  Skin: Negative.   Allergic/Immunologic: Negative.   Neurological: Negative.   Hematological: Negative.   Psychiatric/Behavioral: Negative.     PHYSICAL EXAMINATION:    BP 108/60 (BP Location: Right Arm, Patient Position: Sitting, Cuff Size: Normal)   Pulse 94   Temp 98.1 F (36.7 C) (Oral)   Resp 16   Ht 5' 3.5" (1.613 m)   Wt 182 lb 6.4 oz (82.7 kg)   BMI 31.80 kg/m     General appearance: alert, cooperative and  appears stated age Head: Normocephalic, without obvious abnormality, atraumatic Lungs: clear to auscultation bilaterally Heart: regular rate and rhythm Abdomen: soft, non-tender, no masses,  no organomegaly Back:  No CVA tenderness.   Pelvic: External genitalia:  no lesions              Urethra:  normal appearing urethra with no masses, tenderness or lesions              Bartholins and Skenes: normal                 Vagina: normal appearing vagina with normal color and discharge, no lesions              Cervix: no lesions                Bimanual Exam:  Uterus:  normal size, contour, position, consistency, mobility, non-tender              Adnexa: no mass, fullness, tenderness       Chaperone was present for exam.  ASSESSMENT  Complicated UTI.   PLAN  Discussed UTI and culture results.  Check CBC with diff and BMP.  Finish Ciprofloxacin.  Call if symptoms recur.  Encouraged an earlier call if she has similar symptoms of UTI in the future. Will still follow up on her GC/CT/trich  testing from last week.    An After Visit Summary was printed and given to the patient.  ___15___ minutes face to face time of which over 50% was spent in counseling.

## 2017-07-20 LAB — CBC WITH DIFFERENTIAL/PLATELET
BASOS: 0 %
Basophils Absolute: 0 10*3/uL (ref 0.0–0.2)
EOS (ABSOLUTE): 0.1 10*3/uL (ref 0.0–0.4)
Eos: 2 %
HEMATOCRIT: 36.4 % (ref 34.0–46.6)
HEMOGLOBIN: 12.4 g/dL (ref 11.1–15.9)
IMMATURE GRANS (ABS): 0 10*3/uL (ref 0.0–0.1)
IMMATURE GRANULOCYTES: 0 %
LYMPHS: 23 %
Lymphocytes Absolute: 1.8 10*3/uL (ref 0.7–3.1)
MCH: 31.6 pg (ref 26.6–33.0)
MCHC: 34.1 g/dL (ref 31.5–35.7)
MCV: 93 fL (ref 79–97)
MONOCYTES: 12 %
Monocytes Absolute: 0.9 10*3/uL (ref 0.1–0.9)
NEUTROS ABS: 5 10*3/uL (ref 1.4–7.0)
NEUTROS PCT: 63 %
PLATELETS: 240 10*3/uL (ref 150–450)
RBC: 3.93 x10E6/uL (ref 3.77–5.28)
RDW: 12.6 % (ref 12.3–15.4)
WBC: 7.9 10*3/uL (ref 3.4–10.8)

## 2017-07-20 LAB — BASIC METABOLIC PANEL
BUN / CREAT RATIO: 6 — AB (ref 9–23)
BUN: 6 mg/dL (ref 6–20)
CALCIUM: 9 mg/dL (ref 8.7–10.2)
CHLORIDE: 103 mmol/L (ref 96–106)
CO2: 22 mmol/L (ref 20–29)
Creatinine, Ser: 0.95 mg/dL (ref 0.57–1.00)
GFR, EST AFRICAN AMERICAN: 100 mL/min/{1.73_m2} (ref >59)
GFR, EST NON AFRICAN AMERICAN: 86 mL/min/{1.73_m2} (ref >59)
Glucose: 104 mg/dL — ABNORMAL HIGH (ref 65–99)
Potassium: 3.9 mmol/L (ref 3.5–5.2)
Sodium: 141 mmol/L (ref 134–144)

## 2017-08-04 ENCOUNTER — Ambulatory Visit: Payer: BLUE CROSS/BLUE SHIELD

## 2017-08-25 ENCOUNTER — Telehealth: Payer: Self-pay | Admitting: Obstetrics and Gynecology

## 2017-08-25 NOTE — Telephone Encounter (Signed)
Spoke with patient, requesting to combine upcoming appointments. Next depo provera due 7/3 -7/17  Rescheduled for OV on 09/03/17 for STD TOC, 3rd gardasil and depo provera inj.   6/28 OV and 7/3 nurse visit cancelled.   Routing to provider for final review. Patient is agreeable to disposition. Will close encounter.

## 2017-08-25 NOTE — Telephone Encounter (Signed)
Patient is unable to come on 08/27/17 and would like to reschedule to combine with 09/01/17 appointment.

## 2017-08-27 ENCOUNTER — Ambulatory Visit: Payer: Self-pay | Admitting: Obstetrics and Gynecology

## 2017-09-01 ENCOUNTER — Ambulatory Visit: Payer: BLUE CROSS/BLUE SHIELD

## 2017-09-03 ENCOUNTER — Ambulatory Visit (INDEPENDENT_AMBULATORY_CARE_PROVIDER_SITE_OTHER): Payer: BLUE CROSS/BLUE SHIELD | Admitting: Obstetrics and Gynecology

## 2017-09-03 ENCOUNTER — Encounter: Payer: Self-pay | Admitting: Obstetrics and Gynecology

## 2017-09-03 ENCOUNTER — Other Ambulatory Visit (HOSPITAL_COMMUNITY)
Admission: RE | Admit: 2017-09-03 | Discharge: 2017-09-03 | Disposition: A | Discharge status: Home / Self Care | Payer: BLUE CROSS/BLUE SHIELD | Source: Ambulatory Visit | Attending: Obstetrics and Gynecology | Admitting: Obstetrics and Gynecology

## 2017-09-03 VITALS — BP 116/68 | HR 88 | Temp 98.2°F | Resp 16 | Ht 63.0 in | Wt 184.0 lb

## 2017-09-03 DIAGNOSIS — Z3042 Encounter for surveillance of injectable contraceptive: Secondary | ICD-10-CM | POA: Diagnosis not present

## 2017-09-03 DIAGNOSIS — Z23 Encounter for immunization: Secondary | ICD-10-CM

## 2017-09-03 DIAGNOSIS — Z113 Encounter for screening for infections with a predominantly sexual mode of transmission: Secondary | ICD-10-CM | POA: Diagnosis not present

## 2017-09-03 MED ORDER — MEDROXYPROGESTERONE ACETATE 150 MG/ML IM SUSP
150.0000 mg | Freq: Once | INTRAMUSCULAR | Status: AC
  Administered 2017-09-03: 150 mg via INTRAMUSCULAR

## 2017-09-03 NOTE — Progress Notes (Signed)
GYNECOLOGY  VISIT   HPI: 22 y.o.   Single  African American  female   G1P0010 with No LMP recorded. Patient has had an injection.   here for Gonorrhea recheck and 3rd Gardasil.  No concerns today.   Negative GC/CT/trich 07/19/17. Had testing when she presented and was acutely ill with a complicated urinary tract infection.  No change in partner.   Also due for Depo Provera.   GYNECOLOGIC HISTORY: No LMP recorded. Patient has had an injection. Contraception:  Depo-Provera Menopausal hormone therapy:  n/a Last mammogram:  n/a Last pap smear:   n/a        OB History    Gravida  1   Para  0   Term  0   Preterm  0   AB  1   Living  0     SAB  0   TAB  1   Ectopic  0   Multiple  0   Live Births  0              Patient Active Problem List   Diagnosis Date Noted  . Complicated UTI (urinary tract infection) 07/19/2017    Past Medical History:  Diagnosis Date  . Ankle sprain 08/2015  . Chlamydia 03/2016  . Gonorrhea 04/2017    History reviewed. No pertinent surgical history.  Current Outpatient Medications  Medication Sig Dispense Refill  . gabapentin (NEURONTIN) 100 MG capsule Take 100 mg by mouth as needed.    . medroxyPROGESTERone (DEPO-PROVERA) 150 MG/ML injection Inject 150 mg into the muscle every 3 (three) months.     No current facility-administered medications for this visit.      ALLERGIES: Patient has no known allergies.  Family History  Problem Relation Age of Onset  . Hypertension Mother   . Breast cancer Paternal Grandmother     Social History   Socioeconomic History  . Marital status: Single    Spouse name: Not on file  . Number of children: Not on file  . Years of education: Not on file  . Highest education level: Not on file  Occupational History  . Not on file  Social Needs  . Financial resource strain: Not on file  . Food insecurity:    Worry: Not on file    Inability: Not on file  . Transportation needs:   Medical: Not on file    Non-medical: Not on file  Tobacco Use  . Smoking status: Never Smoker  . Smokeless tobacco: Never Used  Substance and Sexual Activity  . Alcohol use: No  . Drug use: Not Currently  . Sexual activity: Yes    Birth control/protection: Injection  Lifestyle  . Physical activity:    Days per week: Not on file    Minutes per session: Not on file  . Stress: Not on file  Relationships  . Social connections:    Talks on phone: Not on file    Gets together: Not on file    Attends religious service: Not on file    Active member of club or organization: Not on file    Attends meetings of clubs or organizations: Not on file    Relationship status: Not on file  . Intimate partner violence:    Fear of current or ex partner: Not on file    Emotionally abused: Not on file    Physically abused: Not on file    Forced sexual activity: Not on file  Other Topics Concern  .  Not on file  Social History Narrative  . Not on file    Review of Systems  PHYSICAL EXAMINATION:    BP 116/68   Pulse 88   Temp 98.2 F (36.8 C)   Resp 16   Ht 5\' 3"  (1.6 m)   Wt 184 lb (83.5 kg)   BMI 32.59 kg/m     General appearance: alert, cooperative and appears stated age  Pelvic: External genitalia:  no lesions              Urethra:  normal appearing urethra with no masses, tenderness or lesions              Bartholins and Skenes: normal                 Vagina: normal appearing vagina with normal color and discharge, no lesions              Cervix: no lesions.  Friable with exam today.                 Bimanual Exam:  Uterus:  normal size, contour, position, consistency, mobility, non-tender              Adnexa: no mass, fullness, tenderness         Chaperone was present for exam.  ASSESSMENT  Hx gonorrhea.  STD screening.  Contraceptive care.  HPV vaccine.   PLAN  Depo Provera 150 mg IM x 1. Ok for refills until annual exam is due in December 2019.  Third Gardasil  today.  FU GC/CT results.    An After Visit Summary was printed and given to the patient.  __15____ minutes face to face time of which over 50% was spent in counseling.

## 2017-09-03 NOTE — Progress Notes (Signed)
Patient is here for Depo Provera Injection Patient is within Depo Provera Calender Limits yes Next Depo Due between: 9/20-10/4 Last AEX: 02/03/17 BS AEX Scheduled: 02/16/18  Patient is aware when next depo is due  Pt tolerated Injection well.  Routed to provider for review, encounter closed.

## 2017-09-06 LAB — CERVICOVAGINAL ANCILLARY ONLY
Chlamydia: NEGATIVE
NEISSERIA GONORRHEA: NEGATIVE

## 2017-11-30 ENCOUNTER — Telehealth: Payer: Self-pay | Admitting: Obstetrics and Gynecology

## 2017-11-30 NOTE — Telephone Encounter (Signed)
Patient cancelled her depo shot for Wednesday because she does not want to continue with it at this time. Scheduled an appointment for std testing instead.

## 2017-11-30 NOTE — Telephone Encounter (Signed)
Spoke with patient.   1. Patient states she is no longer in a relationship, does not desire birth control at this time.   2.Last depo injection 09/03/17. Patient states she does not have menses with depo. Started spotting 3 wks ago. Denies pain, N/V, fever/chills, urinary symptoms. Patient requesting STD testing.   Recommended patient take UPT, return call to office if positive. If neg, ok to keep OV as scheduled for 10/2 at 1pm with Dr. Edward Jolly. Patient verbalizes understanding and is agreeable.   Routing to provider for final review. Patient is agreeable to disposition. Will close encounter.

## 2017-11-30 NOTE — Progress Notes (Signed)
GYNECOLOGY  VISIT   HPI: 22 y.o.   Single  African American  female   G1P0010 with No LMP recorded. Patient has had an injection.   Here for STD testing & irregular spotting during last month of depo injection.  Pt does not want to continue birth control.  Does not want to continue with Depo Provera or any form of contraception.   Hx gonorrhea and chlamydia.   GYNECOLOGIC HISTORY: No LMP recorded. Patient has had an injection. Contraception:  Depo provera Menopausal hormone therapy:  n/a Last mammogram:  n/a Last pap smear:   N/a upt-neg        OB History    Gravida  1   Para  0   Term  0   Preterm  0   AB  1   Living  0     SAB  0   TAB  1   Ectopic  0   Multiple  0   Live Births  0              There are no active problems to display for this patient.   Past Medical History:  Diagnosis Date  . Ankle sprain 08/2015  . Chlamydia 03/2016  . Gonorrhea 04/2017    History reviewed. No pertinent surgical history.  Current Outpatient Medications  Medication Sig Dispense Refill  . gabapentin (NEURONTIN) 100 MG capsule Take 100 mg by mouth as needed.    . medroxyPROGESTERone (DEPO-PROVERA) 150 MG/ML injection Inject 150 mg into the muscle every 3 (three) months.     No current facility-administered medications for this visit.      ALLERGIES: Patient has no known allergies.  Family History  Problem Relation Age of Onset  . Hypertension Mother   . Breast cancer Paternal Grandmother     Social History   Socioeconomic History  . Marital status: Single    Spouse name: Not on file  . Number of children: Not on file  . Years of education: Not on file  . Highest education level: Not on file  Occupational History  . Not on file  Social Needs  . Financial resource strain: Not on file  . Food insecurity:    Worry: Not on file    Inability: Not on file  . Transportation needs:    Medical: Not on file    Non-medical: Not on file  Tobacco Use   . Smoking status: Never Smoker  . Smokeless tobacco: Never Used  Substance and Sexual Activity  . Alcohol use: No  . Drug use: Yes    Types: Marijuana  . Sexual activity: Yes    Partners: Male    Birth control/protection: Injection  Lifestyle  . Physical activity:    Days per week: Not on file    Minutes per session: Not on file  . Stress: Not on file  Relationships  . Social connections:    Talks on phone: Not on file    Gets together: Not on file    Attends religious service: Not on file    Active member of club or organization: Not on file    Attends meetings of clubs or organizations: Not on file    Relationship status: Not on file  . Intimate partner violence:    Fear of current or ex partner: Not on file    Emotionally abused: Not on file    Physically abused: Not on file    Forced sexual activity: Not on file  Other Topics Concern  . Not on file  Social History Narrative  . Not on file    Review of Systems  Constitutional: Negative.   HENT: Negative.   Eyes: Negative.   Respiratory: Negative.   Cardiovascular: Negative.   Gastrointestinal: Negative.   Endocrine: Negative.   Genitourinary: Negative.   Musculoskeletal: Negative.   Skin: Negative.   Allergic/Immunologic: Negative.   Neurological: Negative.   Hematological: Negative.   Psychiatric/Behavioral: Negative.   All other systems reviewed and are negative.   PHYSICAL EXAMINATION:    BP 120/80   Pulse 68   Resp 16   Wt 173 lb (78.5 kg)   BMI 30.65 kg/m     General appearance: alert, cooperative and appears stated age   Pelvic: External genitalia:  no lesions              Urethra:  normal appearing urethra with no masses, tenderness or lesions              Bartholins and Skenes: normal                 Vagina: normal appearing vagina with normal color and discharge, no lesions              Cervix: no lesions.  Vaginal bleeding noted.  No CMT.                 Bimanual Exam:  Uterus:  normal  size, contour, position, consistency, mobility, non-tender              Adnexa: no mass, fullness, tenderness             Chaperone was present for exam.  ASSESSMENT  STD screening.  Spotting on Depo Provera.   PLAN  STD screening.  Declines Depo Provera.  I discussed emergency contraception and condom use.    An After Visit Summary was printed and given to the patient.  _15_____ minutes face to face time of which over 50% was spent in counseling.

## 2017-12-01 ENCOUNTER — Ambulatory Visit (INDEPENDENT_AMBULATORY_CARE_PROVIDER_SITE_OTHER): Payer: BLUE CROSS/BLUE SHIELD | Admitting: Obstetrics and Gynecology

## 2017-12-01 ENCOUNTER — Other Ambulatory Visit (HOSPITAL_COMMUNITY)
Admission: RE | Admit: 2017-12-01 | Discharge: 2017-12-01 | Disposition: A | Discharge status: Home / Self Care | Payer: BLUE CROSS/BLUE SHIELD | Source: Ambulatory Visit | Attending: Obstetrics and Gynecology | Admitting: Obstetrics and Gynecology

## 2017-12-01 ENCOUNTER — Ambulatory Visit: Payer: BLUE CROSS/BLUE SHIELD

## 2017-12-01 ENCOUNTER — Encounter: Payer: Self-pay | Admitting: Obstetrics and Gynecology

## 2017-12-01 VITALS — BP 120/80 | HR 68 | Resp 16 | Wt 173.0 lb

## 2017-12-01 DIAGNOSIS — Z113 Encounter for screening for infections with a predominantly sexual mode of transmission: Secondary | ICD-10-CM

## 2017-12-01 DIAGNOSIS — N921 Excessive and frequent menstruation with irregular cycle: Secondary | ICD-10-CM

## 2017-12-01 LAB — POCT URINE PREGNANCY: Preg Test, Ur: NEGATIVE

## 2017-12-02 LAB — HEP, RPR, HIV PANEL
HEP B S AG: NEGATIVE
HIV Screen 4th Generation wRfx: NONREACTIVE
RPR: NONREACTIVE

## 2017-12-02 LAB — CERVICOVAGINAL ANCILLARY ONLY
Chlamydia: NEGATIVE
Neisseria Gonorrhea: NEGATIVE
TRICH (WINDOWPATH): NEGATIVE

## 2017-12-02 LAB — HEPATITIS C ANTIBODY: Hep C Virus Ab: <0.1 s/co ratio (ref 0.0–0.9)

## 2017-12-02 LAB — HSV(HERPES SIMPLEX VRS) I + II AB-IGG: HSV 1 Glycoprotein G Ab, IgG: 1.3 index — ABNORMAL HIGH (ref 0.00–0.90)

## 2017-12-05 ENCOUNTER — Encounter: Payer: Self-pay | Admitting: Obstetrics and Gynecology

## 2018-01-24 ENCOUNTER — Encounter: Payer: Self-pay | Admitting: Obstetrics and Gynecology

## 2018-01-24 ENCOUNTER — Ambulatory Visit: Payer: BLUE CROSS/BLUE SHIELD | Admitting: Obstetrics and Gynecology

## 2018-01-24 ENCOUNTER — Other Ambulatory Visit: Payer: Self-pay

## 2018-01-24 VITALS — BP 120/80 | HR 68 | Resp 16 | Wt 179.0 lb

## 2018-01-24 DIAGNOSIS — N912 Amenorrhea, unspecified: Secondary | ICD-10-CM

## 2018-01-24 DIAGNOSIS — Z3009 Encounter for other general counseling and advice on contraception: Secondary | ICD-10-CM

## 2018-01-24 LAB — POCT URINE PREGNANCY: PREG TEST UR: NEGATIVE

## 2018-01-24 NOTE — Progress Notes (Signed)
GYNECOLOGY  VISIT   HPI: 22 y.o.   Single  African American  female   G1P0010 with No LMP recorded. (Menstrual status: Irregular Periods).   here for birth control consult.   Wants to get back on her Depo Provera potentially.  Last injection was July 2019.  Her first injection was March 2019.   Has also used birth control pills.  Difficulty remembering to take them.   GYNECOLOGIC HISTORY: No LMP recorded. (Menstrual status: Irregular Periods). Contraception:  none Menopausal hormone therapy:  none Last mammogram:  n/a Last pap smear:   N/a upt-neg        OB History    Gravida  1   Para  0   Term  0   Preterm  0   AB  1   Living  0     SAB  0   TAB  1   Ectopic  0   Multiple  0   Live Births  0              There are no active problems to display for this patient.   Past Medical History:  Diagnosis Date  . Ankle sprain 08/2015  . Chlamydia 03/2016  . Gonorrhea 04/2017  . HSV-1 infection     History reviewed. No pertinent surgical history.  Current Outpatient Medications  Medication Sig Dispense Refill  . gabapentin (NEURONTIN) 100 MG capsule Take 100 mg by mouth as needed.     No current facility-administered medications for this visit.      ALLERGIES: Patient has no known allergies.  Family History  Problem Relation Age of Onset  . Hypertension Mother   . Breast cancer Paternal Grandmother     Social History   Socioeconomic History  . Marital status: Single    Spouse name: Not on file  . Number of children: Not on file  . Years of education: Not on file  . Highest education level: Not on file  Occupational History  . Not on file  Social Needs  . Financial resource strain: Not on file  . Food insecurity:    Worry: Not on file    Inability: Not on file  . Transportation needs:    Medical: Not on file    Non-medical: Not on file  Tobacco Use  . Smoking status: Never Smoker  . Smokeless tobacco: Never Used  Substance and  Sexual Activity  . Alcohol use: No  . Drug use: Yes    Types: Marijuana  . Sexual activity: Yes    Partners: Male    Birth control/protection: None  Lifestyle  . Physical activity:    Days per week: Not on file    Minutes per session: Not on file  . Stress: Not on file  Relationships  . Social connections:    Talks on phone: Not on file    Gets together: Not on file    Attends religious service: Not on file    Active member of club or organization: Not on file    Attends meetings of clubs or organizations: Not on file    Relationship status: Not on file  . Intimate partner violence:    Fear of current or ex partner: Not on file    Emotionally abused: Not on file    Physically abused: Not on file    Forced sexual activity: Not on file  Other Topics Concern  . Not on file  Social History Narrative  . Not on  file    Review of Systems  Constitutional: Negative.   HENT: Negative.   Eyes: Negative.   Respiratory: Negative.   Cardiovascular: Negative.   Gastrointestinal: Negative.   Endocrine: Negative.   Genitourinary: Negative.   Musculoskeletal: Negative.   Skin: Negative.   Allergic/Immunologic: Negative.   Neurological: Negative.   Hematological: Negative.   Psychiatric/Behavioral: Negative.     PHYSICAL EXAMINATION:    BP 120/80   Pulse 68   Resp 16   Wt 179 lb (81.2 kg)   BMI 31.71 kg/m     General appearance: alert, cooperative and appears stated age   Pelvic: External genitalia:  no lesions              Urethra:  normal appearing urethra with no masses, tenderness or lesions              Bartholins and Skenes: normal                 Vagina: normal appearing vagina with normal color and discharge, no lesions              Cervix: no lesions                Bimanual Exam:  Uterus:  normal size, contour, position, consistency, mobility, non-tender              Adnexa: no mass, fullness, tenderness            Chaperone was present for  exam.  ASSESSMENT  Contraception counseling.   PLAN  Discussed options for IUDs Depo Provera, Nexplanon, OCPs, NuvaRing, Ortho Evra, barrier methods, and emergency contraception.  Detailed discussed of risks and benefits of IUDs.  She in interested in BayardParaGuard IUD.  She will abstain for 2 weeks and then present for IUD insertion.    An After Visit Summary was printed and given to the patient.  __25____ minutes face to face time of which over 50% was spent in counseling.

## 2018-02-16 ENCOUNTER — Ambulatory Visit: Payer: BLUE CROSS/BLUE SHIELD | Admitting: Obstetrics and Gynecology

## 2018-03-04 ENCOUNTER — Ambulatory Visit: Payer: BLUE CROSS/BLUE SHIELD | Admitting: Obstetrics and Gynecology

## 2018-03-08 ENCOUNTER — Ambulatory Visit: Payer: BLUE CROSS/BLUE SHIELD | Admitting: Obstetrics and Gynecology

## 2018-03-08 ENCOUNTER — Encounter: Payer: Self-pay | Admitting: Obstetrics and Gynecology

## 2018-03-08 NOTE — Progress Notes (Deleted)
23 y.o. G1P0010 Single {Race/ethnicity:17218} female here for annual exam.    PCP:     No LMP recorded. (Menstrual status: Irregular Periods).           Sexually active: {yes no:314532}  The current method of family planning is {contraception:315051}.    Exercising: {yes no:314532}  {types:19826} Smoker:  {YES NO:22349}  Health Maintenance: Pap:  *** History of abnormal Pap:  {YES NO:22349} MMG:  *** Colonoscopy:  *** BMD:   ***  Result  *** TDaP:  *** Gardasil:   {YES NO:22349} HIV: Hep C: Screening Labs:  Hb today: ***, Urine today: ***   reports that she has never smoked. She has never used smokeless tobacco. She reports current drug use. Drug: Marijuana. She reports that she does not drink alcohol.  Past Medical History:  Diagnosis Date  . Ankle sprain 08/2015  . Chlamydia 03/2016  . Gonorrhea 04/2017  . HSV-1 infection     No past surgical history on file.  Current Outpatient Medications  Medication Sig Dispense Refill  . gabapentin (NEURONTIN) 100 MG capsule Take 100 mg by mouth as needed.     No current facility-administered medications for this visit.     Family History  Problem Relation Age of Onset  . Hypertension Mother   . Breast cancer Paternal Grandmother     Review of Systems  Exam:   There were no vitals taken for this visit.    General appearance: alert, cooperative and appears stated age Head: Normocephalic, without obvious abnormality, atraumatic Neck: no adenopathy, supple, symmetrical, trachea midline and thyroid normal to inspection and palpation Lungs: clear to auscultation bilaterally Breasts: normal appearance, no masses or tenderness, No nipple retraction or dimpling, No nipple discharge or bleeding, No axillary or supraclavicular adenopathy Heart: regular rate and rhythm Abdomen: soft, non-tender; no masses, no organomegaly Extremities: extremities normal, atraumatic, no cyanosis or edema Skin: Skin color, texture, turgor normal.  No rashes or lesions Lymph nodes: Cervical, supraclavicular, and axillary nodes normal. No abnormal inguinal nodes palpated Neurologic: Grossly normal  Pelvic: External genitalia:  no lesions              Urethra:  normal appearing urethra with no masses, tenderness or lesions              Bartholins and Skenes: normal                 Vagina: normal appearing vagina with normal color and discharge, no lesions              Cervix: no lesions              Pap taken: {yes no:314532} Bimanual Exam:  Uterus:  normal size, contour, position, consistency, mobility, non-tender              Adnexa: no mass, fullness, tenderness              Rectal exam: {yes no:314532}.  Confirms.              Anus:  normal sphincter tone, no lesions  Chaperone was present for exam.  Assessment:   Well woman visit with normal exam.   Plan: Mammogram screening. Recommended self breast awareness. Pap and HR HPV as above. Guidelines for Calcium, Vitamin D, regular exercise program including cardiovascular and weight bearing exercise.   Follow up annually and prn.   Additional counseling given.  {yes [id number]. _______ minutes face to face time of which over 50% was spent  in counseling.    After visit summary provided.

## 2018-03-17 ENCOUNTER — Ambulatory Visit: Payer: BLUE CROSS/BLUE SHIELD | Admitting: Obstetrics and Gynecology

## 2018-03-17 ENCOUNTER — Other Ambulatory Visit (HOSPITAL_COMMUNITY)
Admission: RE | Admit: 2018-03-17 | Discharge: 2018-03-17 | Disposition: A | Discharge status: Home / Self Care | Payer: BLUE CROSS/BLUE SHIELD | Source: Ambulatory Visit | Attending: Obstetrics and Gynecology | Admitting: Obstetrics and Gynecology

## 2018-03-17 ENCOUNTER — Other Ambulatory Visit: Payer: Self-pay

## 2018-03-17 ENCOUNTER — Encounter: Payer: Self-pay | Admitting: Obstetrics and Gynecology

## 2018-03-17 VITALS — BP 128/62 | HR 84 | Resp 16 | Ht 63.0 in | Wt 174.0 lb

## 2018-03-17 DIAGNOSIS — Z113 Encounter for screening for infections with a predominantly sexual mode of transmission: Secondary | ICD-10-CM

## 2018-03-17 DIAGNOSIS — Z3009 Encounter for other general counseling and advice on contraception: Secondary | ICD-10-CM | POA: Diagnosis not present

## 2018-03-17 DIAGNOSIS — Z01419 Encounter for gynecological examination (general) (routine) without abnormal findings: Secondary | ICD-10-CM | POA: Insufficient documentation

## 2018-03-17 NOTE — Patient Instructions (Signed)
EXERCISE AND DIET:  We recommended that you start or continue a regular exercise program for good health. Regular exercise means any activity that makes your heart beat faster and makes you sweat.  We recommend exercising at least 30 minutes per day at least 3 days a week, preferably 4 or 5.  We also recommend a diet low in fat and sugar.  Inactivity, poor dietary choices and obesity can cause diabetes, heart attack, stroke, and kidney damage, among others.    ALCOHOL AND SMOKING:  Women should limit their alcohol intake to no more than 7 drinks/beers/glasses of wine (combined, not each!) per week. Moderation of alcohol intake to this level decreases your risk of breast cancer and liver damage. And of course, no recreational drugs are part of a healthy lifestyle.  And absolutely no smoking or even second hand smoke. Most people know smoking can cause heart and lung diseases, but did you know it also contributes to weakening of your bones? Aging of your skin?  Yellowing of your teeth and nails?  CALCIUM AND VITAMIN D:  Adequate intake of calcium and Vitamin D are recommended.  The recommendations for exact amounts of these supplements seem to change often, but generally speaking 600 mg of calcium (either carbonate or citrate) and 800 units of Vitamin D per day seems prudent. Certain women may benefit from higher intake of Vitamin D.  If you are among these women, your doctor will have told you during your visit.    PAP SMEARS:  Pap smears, to check for cervical cancer or precancers,  have traditionally been done yearly, although recent scientific advances have shown that most women can have pap smears less often.  However, every woman still should have a physical exam from her gynecologist every year. It will include a breast check, inspection of the vulva and vagina to check for abnormal growths or skin changes, a visual exam of the cervix, and then an exam to evaluate the size and shape of the uterus and  ovaries.  And after 22 years of age, a rectal exam is indicated to check for rectal cancers. We will also provide age appropriate advice regarding health maintenance, like when you should have certain vaccines, screening for sexually transmitted diseases, bone density testing, colonoscopy, mammograms, etc.   MAMMOGRAMS:  All women over 40 years old should have a yearly mammogram. Many facilities now offer a "3D" mammogram, which may cost around $50 extra out of pocket. If possible,  we recommend you accept the option to have the 3D mammogram performed.  It both reduces the number of women who will be called back for extra views which then turn out to be normal, and it is better than the routine mammogram at detecting truly abnormal areas.    COLONOSCOPY:  Colonoscopy to screen for colon cancer is recommended for all women at age 50.  We know, you hate the idea of the prep.  We agree, BUT, having colon cancer and not knowing it is worse!!  Colon cancer so often starts as a polyp that can be seen and removed at colonscopy, which can quite literally save your life!  And if your first colonoscopy is normal and you have no family history of colon cancer, most women don't have to have it again for 10 years.  Once every ten years, you can do something that may end up saving your life, right?  We will be happy to help you get it scheduled when you are ready.    Be sure to check your insurance coverage so you understand how much it will cost.  It may be covered as a preventative service at no cost, but you should check your particular policy.     Levonorgestrel intrauterine device (IUD) What is this medicine? LEVONORGESTREL IUD (LEE voe nor jes trel) is a contraceptive (birth control) device. The device is placed inside the uterus by a healthcare professional. It is used to prevent pregnancy. This device can also be used to treat heavy bleeding that occurs during your period. This medicine may be used for other  purposes; ask your health care provider or pharmacist if you have questions. COMMON BRAND NAME(S): Kyleena, LILETTA, Mirena, Skyla What should I tell my health care provider before I take this medicine? They need to know if you have any of these conditions: -abnormal Pap smear -cancer of the breast, uterus, or cervix -diabetes -endometritis -genital or pelvic infection now or in the past -have more than one sexual partner or your partner has more than one partner -heart disease -history of an ectopic or tubal pregnancy -immune system problems -IUD in place -liver disease or tumor -problems with blood clots or take blood-thinners -seizures -use intravenous drugs -uterus of unusual shape -vaginal bleeding that has not been explained -an unusual or allergic reaction to levonorgestrel, other hormones, silicone, or polyethylene, medicines, foods, dyes, or preservatives -pregnant or trying to get pregnant -breast-feeding How should I use this medicine? This device is placed inside the uterus by a health care professional. Talk to your pediatrician regarding the use of this medicine in children. Special care may be needed. Overdosage: If you think you have taken too much of this medicine contact a poison control center or emergency room at once. NOTE: This medicine is only for you. Do not share this medicine with others. What if I miss a dose? This does not apply. Depending on the brand of device you have inserted, the device will need to be replaced every 3 to 5 years if you wish to continue using this type of birth control. What may interact with this medicine? Do not take this medicine with any of the following medications: -amprenavir -bosentan -fosamprenavir This medicine may also interact with the following medications: -aprepitant -armodafinil -barbiturate medicines for inducing sleep or treating seizures -bexarotene -boceprevir -griseofulvin -medicines to treat seizures  like carbamazepine, ethotoin, felbamate, oxcarbazepine, phenytoin, topiramate -modafinil -pioglitazone -rifabutin -rifampin -rifapentine -some medicines to treat HIV infection like atazanavir, efavirenz, indinavir, lopinavir, nelfinavir, tipranavir, ritonavir -St. John's wort -warfarin This list may not describe all possible interactions. Give your health care provider a list of all the medicines, herbs, non-prescription drugs, or dietary supplements you use. Also tell them if you smoke, drink alcohol, or use illegal drugs. Some items may interact with your medicine. What should I watch for while using this medicine? Visit your doctor or health care professional for regular check ups. See your doctor if you or your partner has sexual contact with others, becomes HIV positive, or gets a sexual transmitted disease. This product does not protect you against HIV infection (AIDS) or other sexually transmitted diseases. You can check the placement of the IUD yourself by reaching up to the top of your vagina with clean fingers to feel the threads. Do not pull on the threads. It is a good habit to check placement after each menstrual period. Call your doctor right away if you feel more of the IUD than just the threads or if you cannot feel the   threads at all. The IUD may come out by itself. You may become pregnant if the device comes out. If you notice that the IUD has come out use a backup birth control method like condoms and call your health care provider. Using tampons will not change the position of the IUD and are okay to use during your period. This IUD can be safely scanned with magnetic resonance imaging (MRI) only under specific conditions. Before you have an MRI, tell your healthcare provider that you have an IUD in place, and which type of IUD you have in place. What side effects may I notice from receiving this medicine? Side effects that you should report to your doctor or health care  professional as soon as possible: -allergic reactions like skin rash, itching or hives, swelling of the face, lips, or tongue -fever, flu-like symptoms -genital sores -high blood pressure -no menstrual period for 6 weeks during use -pain, swelling, warmth in the leg -pelvic pain or tenderness -severe or sudden headache -signs of pregnancy -stomach cramping -sudden shortness of breath -trouble with balance, talking, or walking -unusual vaginal bleeding, discharge -yellowing of the eyes or skin Side effects that usually do not require medical attention (report to your doctor or health care professional if they continue or are bothersome): -acne -breast pain -change in sex drive or performance -changes in weight -cramping, dizziness, or faintness while the device is being inserted -headache -irregular menstrual bleeding within first 3 to 6 months of use -nausea This list may not describe all possible side effects. Call your doctor for medical advice about side effects. You may report side effects to FDA at 1-800-FDA-1088. Where should I keep my medicine? This does not apply. NOTE: This sheet is a summary. It may not cover all possible information. If you have questions about this medicine, talk to your doctor, pharmacist, or health care provider.  2019 Elsevier/Gold Standard (2015-11-29 14:14:56)  

## 2018-03-17 NOTE — Progress Notes (Signed)
23 y.o. 581P0010 Single African American female here for annual exam. Patient would like to discuss irregular menses since stopping the birth control.  Stopped Depo Provera.  Wants an IUD that will take away her menses.   New partner.  Using condoms.  PCP: No PCP    Patient's last menstrual period was 02/21/2018.    Lasted 3 - 4 days.  Bled Dec 15 for 4 days.  No Bleeding or spotting since then.        Sexually active: Yes.    The current method of family planning is condoms all of the time per patient.    Exercising: No.  The patient does not participate in regular exercise at present. Smoker:  no  Health Maintenance: Pap:  never History of abnormal Pap:  n/a TDaP:  2012 Gardasil:   Yes, completed series HIV and Hep C: 12/01/17 Negative Screening Labs:  Discuss today if needed   reports that she has never smoked. She has never used smokeless tobacco. She reports current drug use. Drug: Marijuana. She reports that she does not drink alcohol.  Past Medical History:  Diagnosis Date  . Ankle sprain 08/2015  . Chlamydia 03/2016  . Gonorrhea 04/2017  . HSV-1 infection     History reviewed. No pertinent surgical history.  Current Outpatient Medications  Medication Sig Dispense Refill  . gabapentin (NEURONTIN) 100 MG capsule Take 100 mg by mouth as needed.     No current facility-administered medications for this visit.     Family History  Problem Relation Age of Onset  . Hypertension Mother   . Breast cancer Paternal Grandmother     Review of Systems  Constitutional: Negative.   HENT: Negative.   Eyes: Negative.   Respiratory: Negative.   Cardiovascular: Negative.   Gastrointestinal: Negative.   Endocrine: Negative.   Genitourinary:       Irregular menses  Musculoskeletal: Negative.   Skin: Negative.   Allergic/Immunologic: Negative.   Neurological: Negative.   Hematological: Negative.   Psychiatric/Behavioral: Negative.     Exam:   BP 128/62 (BP  Location: Right Arm, Patient Position: Sitting, Cuff Size: Normal)   Pulse 84   Resp 16   Ht 5\' 3"  (1.6 m)   Wt 174 lb (78.9 kg)   LMP 02/21/2018   BMI 30.82 kg/m     General appearance: alert, cooperative and appears stated age Head: Normocephalic, without obvious abnormality, atraumatic Neck: no adenopathy, supple, symmetrical, trachea midline and thyroid normal to inspection and palpation Lungs: clear to auscultation bilaterally Breasts: normal appearance, no masses or tenderness, No nipple retraction or dimpling, No nipple discharge or bleeding, No axillary or supraclavicular adenopathy Heart: regular rate and rhythm Abdomen: soft, non-tender; no masses, no organomegaly Extremities: extremities normal, atraumatic, no cyanosis or edema Skin: Skin color, texture, turgor normal. No rashes or lesions Lymph nodes: Cervical, supraclavicular, and axillary nodes normal. No abnormal inguinal nodes palpated Neurologic: Grossly normal  Pelvic: External genitalia:  no lesions              Urethra:  normal appearing urethra with no masses, tenderness or lesions              Bartholins and Skenes: normal                 Vagina: normal appearing vagina with normal color and discharge, no lesions              Cervix: no lesions  Pap taken: Yes.   Bimanual Exam:  Uterus:  normal size, contour, position, consistency, mobility, non-tender              Adnexa: no mass, fullness, tenderness            Chaperone was present for exam.  Assessment:   Well woman visit with normal exam. Hx gonorrhea, chlamydia and HSV I.  Irregular menses.  Discontinuation of Depo Provera. Desire for IUD and amenorrhea.  Plan: Mammogram screening. Recommended self breast awareness. Pap and HR HPV as above. Guidelines for Calcium, Vitamin D, regular exercise program including cardiovascular and weight bearing exercise. STD screening and routine labs. We discussed Mirena and Rutha Bouchard IUDs risks and  benefits.  She would like to proceed. Follow up annually and prn.   After visit summary provided.

## 2018-03-18 LAB — LIPID PANEL
Chol/HDL Ratio: 1.7 ratio (ref 0.0–4.4)
Cholesterol, Total: 113 mg/dL (ref 100–199)
HDL: 66 mg/dL (ref >39)
LDL Calculated: 39 mg/dL (ref 0–99)
Triglycerides: 39 mg/dL (ref 0–149)
VLDL Cholesterol Cal: 8 mg/dL (ref 5–40)

## 2018-03-18 LAB — CBC
Hematocrit: 37.7 % (ref 34.0–46.6)
Hemoglobin: 13.2 g/dL (ref 11.1–15.9)
MCH: 32.4 pg (ref 26.6–33.0)
MCHC: 35 g/dL (ref 31.5–35.7)
MCV: 93 fL (ref 79–97)
Platelets: 230 10*3/uL (ref 150–450)
RBC: 4.07 x10E6/uL (ref 3.77–5.28)
RDW: 11.4 % — ABNORMAL LOW (ref 11.7–15.4)
WBC: 10 10*3/uL (ref 3.4–10.8)

## 2018-03-18 LAB — HEP, RPR, HIV PANEL
HIV Screen 4th Generation wRfx: NONREACTIVE
Hepatitis B Surface Ag: NEGATIVE
RPR Ser Ql: NONREACTIVE

## 2018-03-18 LAB — COMPREHENSIVE METABOLIC PANEL
ALT: 20 IU/L (ref 0–32)
AST: 17 IU/L (ref 0–40)
Albumin/Globulin Ratio: 1.7 (ref 1.2–2.2)
Albumin: 4.4 g/dL (ref 3.5–5.5)
Alkaline Phosphatase: 70 IU/L (ref 39–117)
BUN/Creatinine Ratio: 10 (ref 9–23)
BUN: 9 mg/dL (ref 6–20)
Bilirubin Total: 0.3 mg/dL (ref 0.0–1.2)
CO2: 22 mmol/L (ref 20–29)
CREATININE: 0.91 mg/dL (ref 0.57–1.00)
Calcium: 9.2 mg/dL (ref 8.7–10.2)
Chloride: 103 mmol/L (ref 96–106)
GFR calc Af Amer: 104 mL/min/{1.73_m2} (ref >59)
GFR calc non Af Amer: 90 mL/min/{1.73_m2} (ref >59)
Globulin, Total: 2.6 g/dL (ref 1.5–4.5)
Glucose: 71 mg/dL (ref 65–99)
Potassium: 4 mmol/L (ref 3.5–5.2)
Sodium: 139 mmol/L (ref 134–144)
Total Protein: 7 g/dL (ref 6.0–8.5)

## 2018-03-18 LAB — HEPATITIS C ANTIBODY: Hep C Virus Ab: <0.1 s/co ratio (ref 0.0–0.9)

## 2018-03-21 LAB — CYTOLOGY - PAP
Chlamydia: NEGATIVE
Diagnosis: NEGATIVE
NEISSERIA GONORRHEA: NEGATIVE
Trichomonas: NEGATIVE

## 2018-03-25 ENCOUNTER — Telehealth: Payer: Self-pay | Admitting: Obstetrics and Gynecology

## 2018-03-25 MED ORDER — MISOPROSTOL 200 MCG PO TABS: ORAL_TABLET | ORAL | 0 refills | Status: DC

## 2018-03-25 NOTE — Telephone Encounter (Signed)
Patient just started her cycle and is ready to have her IUD inserted.

## 2018-03-25 NOTE — Telephone Encounter (Signed)
Patient started menses today.  Ready to schedule IUD insertion. Wants Mirena.   Pre procedure instructions given.  Motrin instructions given. Motrin=Advil=Ibuprofen, 800 mg one hour before appointment. Eat a meal and hydrate well before appointment.  Instructions given on Cytotec and insertion.  Cytotec sent to pharmacy.  Insertion scheduled for 03/28/2018.   Encounter closed.

## 2018-03-28 ENCOUNTER — Ambulatory Visit: Payer: Self-pay | Admitting: Obstetrics and Gynecology

## 2018-03-28 ENCOUNTER — Telehealth: Payer: Self-pay | Admitting: Obstetrics and Gynecology

## 2018-03-28 NOTE — Telephone Encounter (Signed)
Patient would like a return call to reschedule iud insertion.

## 2018-03-28 NOTE — Telephone Encounter (Signed)
OK to close this encounter? Has separate phone note.   Cc- Delorse Limber

## 2018-03-28 NOTE — Telephone Encounter (Signed)
Patient left message on voicemail Sunday cancelling procedure appointment for Monday because of work.

## 2018-03-28 NOTE — Telephone Encounter (Signed)
Message left to return call to [place] at [phone].   Can reschedule with any nurse.  Has instructions and cytotec order.  Started cycle on Friday 03/25/18.

## 2018-05-11 ENCOUNTER — Telehealth: Payer: Self-pay | Admitting: Obstetrics and Gynecology

## 2018-05-11 NOTE — Telephone Encounter (Signed)
Patient cancelled appointment for IUD insertion. To date patient has not returned call to schedule appointment. Please advise on how to proceed.  [facility]

## 2018-05-12 NOTE — Telephone Encounter (Signed)
Ok to close order for IUD and wait for patient to return call.

## 2018-09-19 ENCOUNTER — Ambulatory Visit (INDEPENDENT_AMBULATORY_CARE_PROVIDER_SITE_OTHER): Payer: BLUE CROSS/BLUE SHIELD | Admitting: Certified Nurse Midwife

## 2018-09-19 ENCOUNTER — Telehealth: Payer: Self-pay | Admitting: Obstetrics and Gynecology

## 2018-09-19 ENCOUNTER — Other Ambulatory Visit: Payer: Self-pay

## 2018-09-19 ENCOUNTER — Encounter: Payer: Self-pay | Admitting: Certified Nurse Midwife

## 2018-09-19 VITALS — BP 124/80 | HR 104 | Temp 98.1°F | Resp 14 | Ht 63.0 in | Wt 165.5 lb

## 2018-09-19 DIAGNOSIS — Z113 Encounter for screening for infections with a predominantly sexual mode of transmission: Secondary | ICD-10-CM

## 2018-09-19 DIAGNOSIS — R300 Dysuria: Secondary | ICD-10-CM | POA: Diagnosis not present

## 2018-09-19 DIAGNOSIS — N3001 Acute cystitis with hematuria: Secondary | ICD-10-CM | POA: Diagnosis not present

## 2018-09-19 LAB — POCT URINALYSIS DIPSTICK
Bilirubin, UA: NEGATIVE
Glucose, UA: NEGATIVE
Ketones, UA: NEGATIVE
Nitrite, UA: NEGATIVE
Protein, UA: POSITIVE — AB
Urobilinogen, UA: 0.2 E.U./dL
pH, UA: 6 (ref 5.0–8.0)

## 2018-09-19 MED ORDER — NITROFURANTOIN MONOHYD MACRO 100 MG PO CAPS: 100.0000 mg | ORAL_CAPSULE | Freq: Two times a day (BID) | ORAL | 0 refills | Status: DC

## 2018-09-19 NOTE — Progress Notes (Signed)
23 y.o. Single African American female G1P0010 here with complaint of UTI, with onset this am. Patient complaining of urinary frequency/urgency/ and pain with urination. Patient denies fever, chills, nausea or back pain. No new personal products. Patient feels related to sexual activity. Denies any vaginal symptoms, but desires STD screening, due to new partner. Partner also having symptoms of UTI. Requesting medication for partner.. Contraception is condoms not consistent.  Patient trying to consume  adequate water intake.  Review of Systems  Constitutional: Negative.   HENT: Negative.   Eyes: Negative.   Respiratory: Negative.   Cardiovascular: Negative.   Gastrointestinal: Negative.   Genitourinary: Positive for dysuria and hematuria.  Musculoskeletal: Negative.   Skin: Negative.   Neurological: Negative.   Endo/Heme/Allergies: Negative.   Psychiatric/Behavioral: Negative.     O: Healthy female WDWN Affect: Normal, orientation x 3 Skin : warm and dry CVAT: negative bilateral Abdomen: positive for suprapubic tenderness  Pelvic exam: External genital area: normal, no lesions Bladder,Urethra tender, Urethral meatus: tender Vagina: white vaginal discharge, normal appearance Affirm and Gc/chlamydia obtained Cervix: normal, non tender Uterus:normal,non tender Adnexa: normal non tender, no fullness or masses   A: UTI probable post coital Normal pelvic exam STD screening Contraception condoms not consistent  P: Reviewed findings of UTI and need for treatment.  Rx: Macrobid see order with instructions WCB:JSEGB micro, culture  Lab: Affirm, Gc,Chlamydia Reviewed warning signs and symptoms of UTI and need to advise if occurring. Encouraged to limit soda, tea, and coffee and be sure to increase water intake.   RV prn

## 2018-09-19 NOTE — Telephone Encounter (Signed)
Patient is calling regarding burning, discharge, cold chills, "head cold," blood in urine. Patient stated that she took her temperature before going to work and it was 96 degrees.

## 2018-09-19 NOTE — Telephone Encounter (Signed)
Spoke with patient and complaining of hematuria and dysuria and chills. Denies fever. Requesting appointment for possible UTI. Made appt. today with [name], CNM at 4:00pm.

## 2018-09-20 ENCOUNTER — Telehealth: Payer: Self-pay | Admitting: Certified Nurse Midwife

## 2018-09-20 LAB — URINALYSIS, MICROSCOPIC ONLY: WBC, UA: >30 /hpf — AB (ref 0–5)

## 2018-09-20 LAB — VAGINITIS/VAGINOSIS, DNA PROBE
Candida Species: NEGATIVE
Gardnerella vaginalis: POSITIVE — AB
Trichomonas vaginosis: NEGATIVE

## 2018-09-20 MED ORDER — METRONIDAZOLE 0.75 % VA GEL: 1.0000 | Freq: Two times a day (BID) | VAGINAL | 0 refills | Status: AC

## 2018-09-20 NOTE — Telephone Encounter (Signed)
Spoke with patient. Patient called x2. Patient started Macrobid 100 mg tab bid on 09/20/18 for UTI symptoms. Patient states symptoms have worsened, requesting "a better antibiotic". Reports increased pain with urination, constipation and sore throat. Unable to have BM, reports burning in rectal area. Temp this morning "96-97". Denies any other GYN symptoms, N/V, cough, runny nose, flank pain.   Reviewed options for constipation such as OTC stool softeners, warm fluids and increasing fluids. Recommended patient see PCP for further evaluation of sore throat. Advised urine culture results take 72 to return, results not back yet. Patient states she feels worse and would like a "stronger, better antibiotic". Advised I will review with [name], CNM and return call. Patient request detailed message if no answer on return call, she is working.   [name], CNM- please review and advise.

## 2018-09-20 NOTE — Telephone Encounter (Signed)
Call returned to patient, left deatiled message per paient request. Advised as seen below per [name], CNM. Advised to return call to office if she desire Rx for Pyridum.

## 2018-09-20 NOTE — Telephone Encounter (Signed)
She can start on some Pyridium 100 mg bid x 3 days or obtain Azo OTC twice daily for 3 days, which should help with pain.

## 2018-09-20 NOTE — Telephone Encounter (Signed)
Spoke with patient, advised of results and recommendations per [name], CNM. Patient states she has picked up AZO, pyridum Rx not needed. Rx for metrogel to verified pharmacy. Patient is aware she will be notified of final urine culture results when they are back. Patient verbalizes understanding and is agreeable.   Routing to provider for final review. Patient is agreeable to disposition. Will close encounter.

## 2018-09-20 NOTE — Telephone Encounter (Signed)
-----   Message from Regina Eck, CNM sent at 09/20/2018  1:09 PM EDT ----- Notify patient in addition to her UTI she has BV, which could be causing burning. She can use Metrogel bid x 5 for treatment. I really do not want to mix Flagyl with her Macrobid Yeast and trichomonas was negative

## 2018-09-20 NOTE — Telephone Encounter (Signed)
Patient was seen yesterday and treated for a UTI. She said after starting the medication she "feels much worse and is now constipated". She sounded very distressed.

## 2018-09-21 ENCOUNTER — Telehealth: Payer: Self-pay

## 2018-09-21 LAB — URINE CULTURE

## 2018-09-21 NOTE — Telephone Encounter (Signed)
-----   Message from Regina Eck, CNM sent at 09/21/2018 12:53 PM EDT ----- Notify patient her urine showed low colony mixed urogenital flora, micro showed crystals which indicates she is not drinking enough water, this can lead to kidney stones. Needs to work on 6-8 glasses of water daily. The medication she is taking should be working to help with symptoms now. Gc/chlamydia pending Patient status?

## 2018-09-21 NOTE — Telephone Encounter (Signed)
Left message for call back.

## 2018-09-21 NOTE — Telephone Encounter (Signed)
Patient is returning a call to Joy. °

## 2018-09-21 NOTE — Telephone Encounter (Signed)
Left message to call back  

## 2018-09-22 LAB — GC/CHLAMYDIA PROBE AMP
Chlamydia trachomatis, NAA: NEGATIVE
Neisseria Gonorrhoeae by PCR: POSITIVE — AB

## 2018-09-22 NOTE — Telephone Encounter (Signed)
Spoke with patient, advised as seen below per [name], CNM. Patient reports symptoms are a lot better and improved. Advised patient our office will return call once all results are completed and reviewed. Patient verbalizes understanding and is agreeable.  Routing to covering provider.   Dr. Quincy Simmonds -please review 09/19/18 GC/Chlamydia and advise.   Cc: [name], CNM

## 2018-09-22 NOTE — Telephone Encounter (Signed)
Left message to call Marcia Hartwell, RN at [facility] [phone].   

## 2018-09-22 NOTE — Telephone Encounter (Signed)
Patient returning call to Joy for results. 

## 2018-09-23 NOTE — Telephone Encounter (Signed)
Notes recorded by Burnice Logan, RN on 09/23/2018 at 8:06 AM EDT  Left message to call Sharee Pimple, RN at [facility].   ------   Notes recorded by Regina Eck, CNM on 09/23/2018 at 7:50 AM EDT  Notify patient that her Chlamydia is negative but gonorrhea is positive  She will need to come in today for Ceftriaxone 250 mg IM and Rx Azithromax 1 gm po. TOC in one week. This was probably contributing to her UTI pain

## 2018-09-23 NOTE — Telephone Encounter (Signed)
Left message to call Lamonda Noxon, RN at [facility] [phone].   

## 2018-09-23 NOTE — Telephone Encounter (Signed)
Agree with Ceftriaxone and Azithromycin.  Please wait 2 weeks to do test of cure in order to avoid a false positive.  Cc- Evalee Mutton

## 2018-09-23 NOTE — Telephone Encounter (Signed)
agree

## 2018-09-26 ENCOUNTER — Ambulatory Visit: Payer: Self-pay

## 2018-09-26 ENCOUNTER — Ambulatory Visit (INDEPENDENT_AMBULATORY_CARE_PROVIDER_SITE_OTHER): Payer: BLUE CROSS/BLUE SHIELD | Admitting: Obstetrics and Gynecology

## 2018-09-26 ENCOUNTER — Telehealth: Payer: Self-pay | Admitting: Obstetrics and Gynecology

## 2018-09-26 VITALS — BP 106/62 | HR 88 | Temp 97.5°F | Wt 165.0 lb

## 2018-09-26 DIAGNOSIS — A549 Gonococcal infection, unspecified: Secondary | ICD-10-CM | POA: Diagnosis not present

## 2018-09-26 MED ORDER — AZITHROMYCIN 250 MG PO TABS: 1000.0000 mg | ORAL_TABLET | Freq: Once | ORAL | 0 refills | Status: AC

## 2018-09-26 NOTE — Telephone Encounter (Signed)
Patient arrived in office for Ceftriaxone IM. Patient scheduled while in office of TOC on 8/11 at 4pm with [name], CNM.   Routing to provider for final review. Patient is agreeable to disposition. Will close encounter.

## 2018-09-26 NOTE — Progress Notes (Signed)
Patient arrived late for original appointment.  Ceftriaxone 250 mg injection administer IM in [facility] by Reesa Chew RN at 5:07 pm. Patient tolerated well and left office at 535 pm without complaints.   Late enter will be added 09-27-18.

## 2018-09-26 NOTE — Telephone Encounter (Signed)
Patient [facility] nurse visit for Ceftriaxone IM.   Cc: Dr. Quincy Simmonds.

## 2018-09-26 NOTE — Progress Notes (Deleted)
Patient is here for Ceftriaxone injection. Pt tolerated Injection well in  Routed to provider for review, encounter closed.

## 2018-09-26 NOTE — Telephone Encounter (Signed)
Spoke with patient, advised as seen below per [name], CNM. Rx for azithromycin sent to verified pharmacy. Offered patient 4:30pm nurse visit for today, patient states she needs to discuss with her employer and return call to schedule. Patient verbalizes understanding and is agreeable, will return call after 10:30am. Will schedule TOC during return call.

## 2018-09-26 NOTE — Telephone Encounter (Signed)
Spoke with patient. Nurse visit scheduled for 4:30pm today for Cetriaxone. Patient is at work, states she will schedule TOC OV when she comes in today. Patient verbalizes understanding.

## 2018-09-27 MED ORDER — CEFTRIAXONE SODIUM 250 MG IJ SOLR
250.0000 mg | Freq: Once | INTRAMUSCULAR | Status: AC
  Administered 2018-09-26: 250 mg via INTRAMUSCULAR

## 2018-10-10 ENCOUNTER — Encounter: Payer: Self-pay | Admitting: Certified Nurse Midwife

## 2018-10-10 ENCOUNTER — Ambulatory Visit: Payer: BLUE CROSS/BLUE SHIELD | Admitting: Certified Nurse Midwife

## 2018-10-10 ENCOUNTER — Other Ambulatory Visit: Payer: Self-pay

## 2018-10-10 VITALS — BP 104/64 | HR 64 | Temp 97.4°F | Resp 16 | Wt 163.0 lb

## 2018-10-10 DIAGNOSIS — N912 Amenorrhea, unspecified: Secondary | ICD-10-CM

## 2018-10-10 DIAGNOSIS — Z113 Encounter for screening for infections with a predominantly sexual mode of transmission: Secondary | ICD-10-CM

## 2018-10-10 LAB — POCT URINE PREGNANCY: Preg Test, Ur: NEGATIVE

## 2018-10-10 NOTE — Progress Notes (Signed)
Review of Systems  Constitutional: Negative.   HENT: Negative.   Respiratory: Negative.   Cardiovascular: Negative.   Gastrointestinal: Negative.   Genitourinary: Negative.   Musculoskeletal: Negative.   Skin: Negative.   Neurological: Negative.   Endo/Heme/Allergies: Negative.   Psychiatric/Behavioral: Negative.       23 y.o. Single African American female G1P0010 here for follow up of Gonorrhea treated with Ceftriaxone and Azithromycin. Patient stated she completed the Azithromycin with no problems. Partner was seen and recently treated "a few days ago". Has been sexually active with this partner, no condom use. Denies pelvic pain, or change in discharge. Contraception none, desires to be on OCP again. Took several years ago with no issues. Aex current and no health changes per patient. No period since last visit here. UPT today negative. Would like to start back on OCP. Denies history of migraine headaches or DVT history or family history. No other health issues today.  POCT UPT: negative  O: Healthy WD,WN female Affect: normal, orientation x 3 Skin:warm and dryn Abdomen:soft, non tender Inguinal lymph nodes bilateral: non tender, no enlargement Pelvic exam:EXTERNAL GENITALIA: normal appearing vulva with no masses, tenderness or lesions VAGINA: no abnormal discharge, odor or lesions.  CERVIX: no lesions or cervical motion tenderness and specimens obtained UTERUS: normal size, shape, non tender, anteverted ADNEXA: no masses palpable and nontender RECTUM: normal external appearance, no lesions  A:History of recent Gonorrhea, BV infection, completed medication as directed. Partner recently treated. TOC today and serum testing requested Contraception none Negative UPT, desires OCP again   P: Discussed findings of normal pelvic exam and will await screening to make sure resolved. Discussed risk of STD's and fertility concerns. Discussed consistent condom use for STD prevention  . Lab: GC,Chlamydia, Affirm, HIV, RPR, Hep. C  Discussed risks/benefits/warning signs with OCP use. Discussed importance of consistent daily use for pregnancy prevention. Will need to call when period starts so RX can be called in. Patient agreeable. She will need Rx Loestrin 24 Fe.  Rv prn, aex

## 2018-10-11 ENCOUNTER — Ambulatory Visit: Payer: Self-pay | Admitting: Certified Nurse Midwife

## 2018-10-11 LAB — HEPATITIS C ANTIBODY: Hep C Virus Ab: 0.2 s/co ratio (ref 0.0–0.9)

## 2018-10-11 LAB — RPR: RPR Ser Ql: NONREACTIVE

## 2018-10-11 LAB — HIV ANTIBODY (ROUTINE TESTING W REFLEX): HIV Screen 4th Generation wRfx: NONREACTIVE

## 2018-10-12 LAB — VAGINITIS/VAGINOSIS, DNA PROBE
Candida Species: NEGATIVE
Gardnerella vaginalis: POSITIVE — AB
Trichomonas vaginosis: NEGATIVE

## 2018-10-14 ENCOUNTER — Telehealth: Payer: Self-pay

## 2018-10-14 LAB — GC/CHLAMYDIA PROBE AMP
Chlamydia trachomatis, NAA: NEGATIVE
Neisseria Gonorrhoeae by PCR: NEGATIVE

## 2018-10-14 NOTE — Telephone Encounter (Signed)
Left message for call back.

## 2018-10-14 NOTE — Telephone Encounter (Signed)
-----   Message from Regina Eck, CNM sent at 10/13/2018  7:23 PM EDT ----- Notify patient her vaginal screening was negative for yeast and trichomonas. BV positive. Due to no contraception she will need to call with period so she can be treated and also start back on OCP. GC/chlamydia pending

## 2018-10-17 NOTE — Telephone Encounter (Signed)
Left message for call back.

## 2018-10-17 NOTE — Telephone Encounter (Signed)
Unable to reach patient. Patient viewed other mychart results. Released message to mychart.

## 2018-10-18 NOTE — Telephone Encounter (Signed)
Left message for call back.

## 2018-10-19 NOTE — Telephone Encounter (Signed)
Patient returning call.

## 2018-10-19 NOTE — Telephone Encounter (Signed)
Patient notified of results as written by provider 

## 2018-10-21 ENCOUNTER — Telehealth: Payer: Self-pay | Admitting: Obstetrics and Gynecology

## 2018-10-21 NOTE — Telephone Encounter (Signed)
Patient is calling regarding being 9 days late for her cycle. Patient stated that she has taken 2 UPTs and they have both been negative.

## 2018-10-21 NOTE — Telephone Encounter (Signed)
Patient notified, is aware to notify office if menses starts, can cancel OV. Patient verbalizes understanding and is agreeable.   Encounter closed.

## 2018-10-21 NOTE — Telephone Encounter (Signed)
Spoke with patient. Seen in office on 10/10/18, positive BV, no contraceptive, waiting for menses to start for treatment. LMP 7/4 -09/07/18. Denies any other GYN symptoms. UPT on 8/19 and 8/20 negative. Patient request OV for pregnancy test. Patient declined OV for today at 4:30 or Monday at [place] scheduled for 8/25 at 9am with [name], CNM. Advised [name], CNM will review, our office will return call if any additional recommendations. Patient agreeable.   [name], CNM -please review.

## 2018-10-21 NOTE — Telephone Encounter (Signed)
Ok to schedule. Hopefully period will start prior

## 2018-10-25 ENCOUNTER — Other Ambulatory Visit: Payer: Self-pay | Admitting: Certified Nurse Midwife

## 2018-10-25 ENCOUNTER — Ambulatory Visit: Payer: Self-pay | Admitting: Certified Nurse Midwife

## 2018-10-25 DIAGNOSIS — B009 Herpesviral infection, unspecified: Secondary | ICD-10-CM

## 2018-10-25 MED ORDER — VALACYCLOVIR HCL 1 G PO TABS: ORAL_TABLET | ORAL | 0 refills | Status: DC

## 2018-10-25 MED ORDER — METRONIDAZOLE 500 MG PO TABS: 500.0000 mg | ORAL_TABLET | Freq: Two times a day (BID) | ORAL | 0 refills | Status: DC

## 2018-10-25 NOTE — Telephone Encounter (Signed)
Returned call to patient. Menses started on 10/23/18, declines to reschedule OV. Patient to call with start of menses for treatment for BV and HSV, see result note dated 10/10/18. Confirmed pharmacy on file. Advised will review with [name], CNM and return call to notify of RXs. Patient agreeable.   [name], CNM -please advise.

## 2018-10-25 NOTE — Telephone Encounter (Signed)
Patient would like to reschedule appointment for amenorrhea originally scheduled for today 10/25/18. She has a specific schedule and nothing available that I can schedule her into during these times.

## 2018-10-25 NOTE — Telephone Encounter (Signed)
Ok to treat for BV with Flagyl 500 mg now and also can have Valtrex if has started period for oral HSV

## 2018-10-25 NOTE — Telephone Encounter (Signed)
[name], CNM -please advise on Valtrex Rx. Patient does not have a previous Rx on file.

## 2018-10-25 NOTE — Telephone Encounter (Signed)
Call returned to patient to notify of prescriptions. Advised prescriptions discussed have been sent to [facility] on file, follow instructions. Return call to office if any additional questions.

## 2018-11-03 ENCOUNTER — Ambulatory Visit: Payer: Self-pay | Admitting: Psychiatry

## 2018-12-26 ENCOUNTER — Telehealth: Payer: Self-pay | Admitting: Obstetrics and Gynecology

## 2018-12-26 NOTE — Telephone Encounter (Signed)
Patient would like documentation that she is not on birth control.

## 2018-12-27 NOTE — Telephone Encounter (Signed)
Left message to call Jill or Yazid Pop, RN at [facility] [phone].  

## 2018-12-27 NOTE — Telephone Encounter (Signed)
Spoke with patient. LMP 11/21/18. Positive UPT 12/27/18. No contraceptive. Hx Gonorrhea. Denies vaginal bleeding, pelvic pain, N/V, fever/chills. Requesting OV for pregnancy confirmation.   OV scheduled for 10/28 at 3pm with Dr. Quincy Simmonds. ER precautions reviewed for pain and/or vaginal bleeding. [id number] prescreen negative, precautions reviewed.    Patient states she was seen in the office in 03/2018 for her AEX, asking if an IUD was inserted? Advised per review of AEX 03/28/18,  IUD was discussed, never returned for insertion.   Advised I will update Dr. Quincy Simmonds and return call if any additional recommendations.   Routing to provider for final review. Patient is agreeable to disposition. Will close encounter.

## 2018-12-28 ENCOUNTER — Ambulatory Visit: Payer: BLUE CROSS/BLUE SHIELD | Admitting: Obstetrics and Gynecology

## 2018-12-28 ENCOUNTER — Other Ambulatory Visit: Payer: Self-pay

## 2018-12-28 ENCOUNTER — Other Ambulatory Visit: Payer: Self-pay | Admitting: Obstetrics and Gynecology

## 2018-12-28 ENCOUNTER — Encounter: Payer: Self-pay | Admitting: Obstetrics and Gynecology

## 2018-12-28 VITALS — Ht 63.0 in | Wt 170.4 lb

## 2018-12-28 DIAGNOSIS — N926 Irregular menstruation, unspecified: Secondary | ICD-10-CM

## 2018-12-28 LAB — POCT URINE PREGNANCY: Preg Test, Ur: POSITIVE — AB

## 2018-12-28 NOTE — Progress Notes (Signed)
GYNECOLOGY  VISIT   HPI: 23 y.o.   Single  African American  female   G1P0010 with Patient's last menstrual period was 11/21/2018 (exact date).   here for missed menses.  She had a positive test 2 days ago.  She and her partner are supportive of the pregnancy.   She has some cramping sensation in both of her thighs.  Comes and goes.  No spotting.  Having breast tenderness, urinary frequency, bloating.   UPT:Pos   GYNECOLOGIC HISTORY: Patient's last menstrual period was 11/21/2018 (exact date). Contraception: None Menopausal hormone therapy:  n/a Last mammogram:  n/a Last pap smear: 03-17-18 Neg        OB History    Gravida  1   Para  0   Term  0   Preterm  0   AB  1   Living  0     SAB  0   TAB  1   Ectopic  0   Multiple  0   Live Births  0              There are no active problems to display for this patient.   Past Medical History:  Diagnosis Date  . Ankle sprain 08/2015  . Chlamydia 03/2016  . Gonorrhea 04/2017   gonorrhea positive 2020 also  . HSV-1 infection     History reviewed. No pertinent surgical history.  Current Outpatient Medications  Medication Sig Dispense Refill  . valACYclovir (VALTREX) 1000 MG tablet Take one tablet twice daily for one day at onset 30 tablet 0   No current facility-administered medications for this visit.      ALLERGIES: Patient has no known allergies.  Family History  Problem Relation Age of Onset  . Hypertension Mother   . Breast cancer Paternal Grandmother     Social History   Socioeconomic History  . Marital status: Single    Spouse name: Not on file  . Number of children: Not on file  . Years of education: Not on file  . Highest education level: Not on file  Occupational History  . Not on file  Social Needs  . Financial resource strain: Not on file  . Food insecurity    Worry: Not on file    Inability: Not on file  . Transportation needs    Medical: Not on file    Non-medical: Not  on file  Tobacco Use  . Smoking status: Never Smoker  . Smokeless tobacco: Never Used  Substance and Sexual Activity  . Alcohol use: No  . Drug use: Yes    Types: Marijuana  . Sexual activity: Yes    Partners: Male  Lifestyle  . Physical activity    Days per week: Not on file    Minutes per session: Not on file  . Stress: Not on file  Relationships  . Social Musician on phone: Not on file    Gets together: Not on file    Attends religious service: Not on file    Active member of club or organization: Not on file    Attends meetings of clubs or organizations: Not on file    Relationship status: Not on file  . Intimate partner violence    Fear of current or ex partner: Not on file    Emotionally abused: Not on file    Physically abused: Not on file    Forced sexual activity: Not on file  Other Topics Concern  .  Not on file  Social History Narrative  . Not on file    Review of Systems  All other systems reviewed and are negative.   PHYSICAL EXAMINATION:    Ht 5\' 3"  (1.6 m)   Wt 170 lb 6.4 oz (77.3 kg)   LMP 11/21/2018 (Exact Date)   BMI 30.19 kg/m     General appearance: alert, cooperative and appears stated age  Pelvic: External genitalia:  no lesions              Urethra:  normal appearing urethra with no masses, tenderness or lesions              Bartholins and Skenes: normal                 Vagina: normal appearing vagina with normal color and discharge, no lesions              Cervix: no lesions                Bimanual Exam:  Uterus:  5 week size, nontender.              Adnexa: no mass, fullness, tenderness             Chaperone was present for exam.  ASSESSMENT  5 + 2 weeks.  Hx gonorrhea, chlamydia.   PLAN  Ectopic precautions given.  HCG monitoring now and in 48 hours.  [place] to follow. PNV recommended.  Avoidance of exposures - tobacco, ETOH, uncooked/prepared foods, litter box, discussed diet, exercise and sex is pregnancy.   Reading suggested with What to Expect When Expecting.  List of OB offices to patient.    An After Visit Summary was printed and given to the patient.  _15_____ minutes face to face time of which over 50% was spent in counseling.

## 2018-12-28 NOTE — Patient Instructions (Addendum)
Rebecca Adams,   Congratulations!  You are 5 weeks and 2 days pregnant by the first day of your last menstrual period.   Your urine pregnancy test is positive today!  You will want to get a copy of What to Expect When Expecting as a resource for your during your pregnancy care.  You will having monitoring of your pregnancy hormone during the days ahead.  You are at risk for an ectopic pregnancy due to prior pelvic infections.  Ectopic pregnancy is a pregnancy outside of the normal uterine cavity.  Ectopic pregnancy can rupture a fallopian tube and cause internal hemorrhage in a woman.  This can be a life threatening emergency.  Until we have confirmation of the location of the pregnancy, I recommend pelvic rest.  This means no sex.  Once the pregnancy location is confirmed, you may resume all normal activities including sex and exercise.   We will let you know when you blood test returns.  You will likely need to repeat this on Friday this week!   You will need to consider a care team for your pregnancy.  I have given you a list of obstetric doctors in the community to choose from.   Thank you!  Josefa Half, MD

## 2018-12-29 ENCOUNTER — Other Ambulatory Visit: Payer: Self-pay | Admitting: *Deleted

## 2018-12-29 DIAGNOSIS — Z3201 Encounter for pregnancy test, result positive: Secondary | ICD-10-CM

## 2018-12-29 DIAGNOSIS — N912 Amenorrhea, unspecified: Secondary | ICD-10-CM

## 2018-12-29 LAB — BETA HCG QUANT (REF LAB): hCG Quant: 3465 m[IU]/mL

## 2019-01-05 ENCOUNTER — Ambulatory Visit: Payer: BLUE CROSS/BLUE SHIELD | Admitting: Obstetrics and Gynecology

## 2019-01-05 ENCOUNTER — Ambulatory Visit (INDEPENDENT_AMBULATORY_CARE_PROVIDER_SITE_OTHER): Payer: BLUE CROSS/BLUE SHIELD

## 2019-01-05 ENCOUNTER — Other Ambulatory Visit: Payer: Self-pay

## 2019-01-05 ENCOUNTER — Encounter: Payer: Self-pay | Admitting: Obstetrics and Gynecology

## 2019-01-05 VITALS — BP 110/70 | HR 80 | Temp 98.0°F | Ht 63.5 in | Wt 171.2 lb

## 2019-01-05 DIAGNOSIS — Z3201 Encounter for pregnancy test, result positive: Secondary | ICD-10-CM | POA: Diagnosis not present

## 2019-01-05 DIAGNOSIS — Z3687 Encounter for antenatal screening for uncertain dates: Secondary | ICD-10-CM | POA: Diagnosis not present

## 2019-01-05 DIAGNOSIS — N912 Amenorrhea, unspecified: Secondary | ICD-10-CM | POA: Diagnosis not present

## 2019-01-05 DIAGNOSIS — Z3490 Encounter for supervision of normal pregnancy, unspecified, unspecified trimester: Secondary | ICD-10-CM

## 2019-01-05 NOTE — Progress Notes (Signed)
GYNECOLOGY  VISIT   HPI: 23 y.o.   Single  African American  female   G1P0010 with Patient's last menstrual period was 11/21/2018 (approximate).   here for viability ultrasound.    Fatigue and minor nausea.  GYNECOLOGIC HISTORY: Patient's last menstrual period was 11/21/2018 (approximate). Contraception: None Menopausal hormone therapy:  n/a Last mammogram:  n/a Last pap smear: 03-17-18 Neg        OB History    Gravida  1   Para  0   Term  0   Preterm  0   AB  1   Living  0     SAB  0   TAB  1   Ectopic  0   Multiple  0   Live Births  0              There are no active problems to display for this patient.   Past Medical History:  Diagnosis Date  . Ankle sprain 08/2015  . Chlamydia 03/2016  . Gonorrhea 04/2017   gonorrhea positive 2020 also  . HSV-1 infection     History reviewed. No pertinent surgical history.  Current Outpatient Medications  Medication Sig Dispense Refill  . Prenatal Vit-Fe Fumarate-FA (PRENATAL VITAMINS PO) Take 1 tablet by mouth daily.    . valACYclovir (VALTREX) 1000 MG tablet Take one tablet twice daily for one day at onset 30 tablet 0   No current facility-administered medications for this visit.      ALLERGIES: Patient has no known allergies.  Family History  Problem Relation Age of Onset  . Hypertension Mother   . Breast cancer Paternal Grandmother     Social History   Socioeconomic History  . Marital status: Single    Spouse name: Not on file  . Number of children: Not on file  . Years of education: Not on file  . Highest education level: Not on file  Occupational History  . Not on file  Social Needs  . Financial resource strain: Not on file  . Food insecurity    Worry: Not on file    Inability: Not on file  . Transportation needs    Medical: Not on file    Non-medical: Not on file  Tobacco Use  . Smoking status: Never Smoker  . Smokeless tobacco: Never Used  Substance and Sexual Activity  .  Alcohol use: No  . Drug use: Yes    Types: Marijuana  . Sexual activity: Yes    Partners: Male  Lifestyle  . Physical activity    Days per week: Not on file    Minutes per session: Not on file  . Stress: Not on file  Relationships  . Social Herbalist on phone: Not on file    Gets together: Not on file    Attends religious service: Not on file    Active member of club or organization: Not on file    Attends meetings of clubs or organizations: Not on file    Relationship status: Not on file  . Intimate partner violence    Fear of current or ex partner: Not on file    Emotionally abused: Not on file    Physically abused: Not on file    Forced sexual activity: Not on file  Other Topics Concern  . Not on file  Social History Narrative  . Not on file    Review of Systems  All other systems reviewed and are negative.  PHYSICAL EXAMINATION:    BP 110/70   Pulse 80   Temp 98 F (36.7 C)   Ht 5' 3.5" (1.613 m)   LMP 11/21/2018 (Approximate)   BMI 29.71 kg/m     General appearance: alert, cooperative and appears stated age   OB ultrasound: Viable IUP.  6+ 3 weeks. Size equal dates.  FH 122.  Left CL cyst.  Cervix closed.   ASSESSMENT  Viable early pregnancy.  Nausea of pregnancy.  PLAN  Reassurance regarding location and development of pregnancy.  Benefit of early pregnancy care for potential genetic testing discussed with patient.  We reviewed options for treating nausea of pregnancy - ginger products, vit B6, Unisom 12 tablet.  List of OB providers/offices to patient.  FU after post partum visit.    An After Visit Summary was printed and given to the patient.  _15_____ minutes face to face time of which over 50% was spent in counseling.

## 2019-02-14 ENCOUNTER — Telehealth: Payer: Self-pay

## 2019-02-14 NOTE — Telephone Encounter (Signed)
No notes to document. Please delete.

## 2019-02-14 NOTE — Telephone Encounter (Signed)
Received form from [employer] to complete for patient to receive breast pump. Tried several times to contact patient and unable to reach her. Phone numbers incorrect. This form needs to go to obstetrician. MyChart message sent to patient.

## 2019-02-28 NOTE — Telephone Encounter (Signed)
Called patient at both numbers in her chart to discuss [place] receiving form from [employer] in error. Both numbers incorrect. I had sent patient a MyChart message 02-14-19 asking her to call our office and patient has not responded.  Dr.Silva can we close encounter? Routed to provider.

## 2019-03-01 NOTE — Telephone Encounter (Signed)
Encounter reviewed and closed.  

## 2019-03-03 NOTE — L&D Delivery Note (Signed)
Delivery Note At 6:29 PM a viable and healthy female was delivered via SVD APGAR: 8, 9;  weight  Pending Placenta status: Spontaneous;Pathology, Intact. Due to meconium  Cord: 3 vessels with the following complications: None.  Infant vigorous, cleared for skin to skin by NICU  Anesthesia: Epidural Episiotomy: None Lacerations: None Suture Repair:intact Est. Blood Loss (mL): 300  Mom to postpartum.  Baby to Couplet care / Skin to Skin.  Victorino Dike B Jett Fukuda 08/15/2019, 6:50 PM

## 2019-03-20 ENCOUNTER — Ambulatory Visit: Payer: BLUE CROSS/BLUE SHIELD | Admitting: Obstetrics and Gynecology

## 2019-04-17 DIAGNOSIS — Z3A21 21 weeks gestation of pregnancy: Secondary | ICD-10-CM | POA: Diagnosis not present

## 2019-04-17 DIAGNOSIS — Z113 Encounter for screening for infections with a predominantly sexual mode of transmission: Secondary | ICD-10-CM | POA: Diagnosis not present

## 2019-04-17 DIAGNOSIS — Z3492 Encounter for supervision of normal pregnancy, unspecified, second trimester: Secondary | ICD-10-CM | POA: Diagnosis not present

## 2019-04-17 DIAGNOSIS — Z363 Encounter for antenatal screening for malformations: Secondary | ICD-10-CM | POA: Diagnosis not present

## 2019-05-15 ENCOUNTER — Encounter (HOSPITAL_COMMUNITY): Payer: Self-pay

## 2019-05-15 ENCOUNTER — Encounter: Payer: Self-pay | Admitting: Certified Nurse Midwife

## 2019-05-15 ENCOUNTER — Ambulatory Visit (HOSPITAL_COMMUNITY): Payer: Medicaid Other

## 2019-05-17 ENCOUNTER — Encounter: Payer: Self-pay | Admitting: Certified Nurse Midwife

## 2019-06-21 DIAGNOSIS — Z23 Encounter for immunization: Secondary | ICD-10-CM | POA: Diagnosis not present

## 2019-08-03 DIAGNOSIS — Z3493 Encounter for supervision of normal pregnancy, unspecified, third trimester: Secondary | ICD-10-CM | POA: Diagnosis not present

## 2019-08-03 DIAGNOSIS — Z113 Encounter for screening for infections with a predominantly sexual mode of transmission: Secondary | ICD-10-CM | POA: Diagnosis not present

## 2019-08-07 LAB — OB RESULTS CONSOLE GBS: GBS: NEGATIVE

## 2019-08-15 ENCOUNTER — Inpatient Hospital Stay (HOSPITAL_COMMUNITY): Payer: BC Managed Care – PPO | Admitting: Anesthesiology

## 2019-08-15 ENCOUNTER — Other Ambulatory Visit: Payer: Self-pay

## 2019-08-15 ENCOUNTER — Encounter (HOSPITAL_COMMUNITY): Payer: Self-pay | Admitting: Obstetrics & Gynecology

## 2019-08-15 ENCOUNTER — Inpatient Hospital Stay (HOSPITAL_COMMUNITY)
Admission: AD | Admit: 2019-08-15 | Discharge: 2019-08-15 | Disposition: A | Discharge status: Home / Self Care | Payer: BC Managed Care – PPO | Source: Home / Self Care | Attending: Obstetrics & Gynecology | Admitting: Obstetrics & Gynecology

## 2019-08-15 ENCOUNTER — Inpatient Hospital Stay (HOSPITAL_COMMUNITY)
Admission: AD | Admit: 2019-08-15 | Discharge: 2019-08-17 | DRG: 807 | Disposition: A | Discharge status: Home / Self Care | Payer: BC Managed Care – PPO | Attending: Obstetrics & Gynecology | Admitting: Obstetrics & Gynecology

## 2019-08-15 ENCOUNTER — Encounter (HOSPITAL_COMMUNITY): Payer: Self-pay | Admitting: Obstetrics and Gynecology

## 2019-08-15 DIAGNOSIS — Z20822 Contact with and (suspected) exposure to covid-19: Secondary | ICD-10-CM | POA: Diagnosis not present

## 2019-08-15 DIAGNOSIS — Z3A00 Weeks of gestation of pregnancy not specified: Secondary | ICD-10-CM | POA: Insufficient documentation

## 2019-08-15 DIAGNOSIS — O479 False labor, unspecified: Secondary | ICD-10-CM | POA: Insufficient documentation

## 2019-08-15 DIAGNOSIS — Z3A37 37 weeks gestation of pregnancy: Secondary | ICD-10-CM | POA: Diagnosis not present

## 2019-08-15 DIAGNOSIS — O26893 Other specified pregnancy related conditions, third trimester: Secondary | ICD-10-CM | POA: Diagnosis not present

## 2019-08-15 LAB — TYPE AND SCREEN
ABO/RH(D): O POS
Antibody Screen: NEGATIVE

## 2019-08-15 LAB — SARS CORONAVIRUS 2 BY RT PCR (HOSPITAL ORDER, PERFORMED IN ~~LOC~~ HOSPITAL LAB): SARS Coronavirus 2: NEGATIVE

## 2019-08-15 LAB — CBC
HCT: 37.3 % (ref 36.0–46.0)
Hemoglobin: 12.4 g/dL (ref 12.0–15.0)
MCH: 31.9 pg (ref 26.0–34.0)
MCHC: 33.2 g/dL (ref 30.0–36.0)
MCV: 95.9 fL (ref 80.0–100.0)
Platelets: 199 10*3/uL (ref 150–400)
RBC: 3.89 MIL/uL (ref 3.87–5.11)
RDW: 12.8 % (ref 11.5–15.5)
WBC: 16.9 10*3/uL — ABNORMAL HIGH (ref 4.0–10.5)
nRBC: 0 % (ref 0.0–0.2)

## 2019-08-15 LAB — ABO/RH: ABO/RH(D): O POS

## 2019-08-15 LAB — URINALYSIS, ROUTINE W REFLEX MICROSCOPIC
Bilirubin Urine: NEGATIVE
Glucose, UA: NEGATIVE mg/dL
Hgb urine dipstick: NEGATIVE
Ketones, ur: NEGATIVE mg/dL
Leukocytes,Ua: NEGATIVE
Nitrite: NEGATIVE
Protein, ur: NEGATIVE mg/dL
Specific Gravity, Urine: 1.018 (ref 1.005–1.030)
pH: 7 (ref 5.0–8.0)

## 2019-08-15 LAB — RPR: RPR Ser Ql: NONREACTIVE

## 2019-08-15 LAB — POCT FERN TEST: POCT Fern Test: POSITIVE

## 2019-08-15 MED ORDER — BISACODYL 10 MG RE SUPP: 10.0000 mg | Freq: Every day | RECTAL | Status: DC | PRN

## 2019-08-15 MED ORDER — LIDOCAINE HCL (PF) 1 % IJ SOLN
INTRAMUSCULAR | Status: DC | PRN
  Administered 2019-08-15: 7 mL via EPIDURAL
  Administered 2019-08-15: 5 mL via EPIDURAL

## 2019-08-15 MED ORDER — OXYCODONE-ACETAMINOPHEN 5-325 MG PO TABS: 1.0000 | ORAL_TABLET | ORAL | Status: DC | PRN

## 2019-08-15 MED ORDER — COCONUT OIL OIL
1.0000 "application " | TOPICAL_OIL | Status: DC | PRN
  Administered 2019-08-16: 1 via TOPICAL

## 2019-08-15 MED ORDER — BENZOCAINE-MENTHOL 20-0.5 % EX AERO
1.0000 "application " | INHALATION_SPRAY | CUTANEOUS | Status: DC | PRN
  Administered 2019-08-15: 1 via TOPICAL
  Filled 2019-08-15: qty 56

## 2019-08-15 MED ORDER — PHENYLEPHRINE 40 MCG/ML (10ML) SYRINGE FOR IV PUSH (FOR BLOOD PRESSURE SUPPORT)
PREFILLED_SYRINGE | INTRAVENOUS | Status: AC
  Filled 2019-08-15: qty 10

## 2019-08-15 MED ORDER — DIPHENHYDRAMINE HCL 50 MG/ML IJ SOLN: 12.5000 mg | INTRAMUSCULAR | Status: DC | PRN

## 2019-08-15 MED ORDER — FENTANYL-BUPIVACAINE-NACL 0.5-0.125-0.9 MG/250ML-% EP SOLN
EPIDURAL | Status: AC
  Filled 2019-08-15: qty 250

## 2019-08-15 MED ORDER — OXYTOCIN BOLUS FROM INFUSION
333.0000 mL | Freq: Once | INTRAVENOUS | Status: AC
  Administered 2019-08-15: 333 mL via INTRAVENOUS

## 2019-08-15 MED ORDER — FENTANYL-BUPIVACAINE-NACL 0.5-0.125-0.9 MG/250ML-% EP SOLN: 12.0000 mL/h | EPIDURAL | Status: DC | PRN

## 2019-08-15 MED ORDER — PHENYLEPHRINE 40 MCG/ML (10ML) SYRINGE FOR IV PUSH (FOR BLOOD PRESSURE SUPPORT): 80.0000 ug | PREFILLED_SYRINGE | INTRAVENOUS | Status: DC | PRN

## 2019-08-15 MED ORDER — PRENATAL MULTIVITAMIN CH
1.0000 | ORAL_TABLET | Freq: Every day | ORAL | Status: DC
  Administered 2019-08-16 – 2019-08-17 (×2): 1 via ORAL
  Filled 2019-08-15 (×2): qty 1

## 2019-08-15 MED ORDER — LACTATED RINGERS IV SOLN
500.0000 mL | INTRAVENOUS | Status: DC | PRN
  Administered 2019-08-15: 1000 mL via INTRAVENOUS

## 2019-08-15 MED ORDER — BUPIVACAINE HCL (PF) 0.25 % IJ SOLN
INTRAMUSCULAR | Status: DC | PRN
  Administered 2019-08-15 (×2): 5 mL via EPIDURAL

## 2019-08-15 MED ORDER — OXYCODONE-ACETAMINOPHEN 5-325 MG PO TABS: 2.0000 | ORAL_TABLET | ORAL | Status: DC | PRN

## 2019-08-15 MED ORDER — SODIUM CHLORIDE (PF) 0.9 % IJ SOLN
INTRAMUSCULAR | Status: DC | PRN
  Administered 2019-08-15: 12 mL/h via EPIDURAL

## 2019-08-15 MED ORDER — LACTATED RINGERS IV SOLN: INTRAVENOUS | Status: DC

## 2019-08-15 MED ORDER — ACETAMINOPHEN 325 MG PO TABS
650.0000 mg | ORAL_TABLET | ORAL | Status: DC | PRN
  Administered 2019-08-16: 650 mg via ORAL
  Filled 2019-08-15: qty 2

## 2019-08-15 MED ORDER — OXYTOCIN-SODIUM CHLORIDE 30-0.9 UT/500ML-% IV SOLN
2.5000 [IU]/h | INTRAVENOUS | Status: DC
  Filled 2019-08-15: qty 500

## 2019-08-15 MED ORDER — DIBUCAINE (PERIANAL) 1 % EX OINT: 1.0000 "application " | TOPICAL_OINTMENT | CUTANEOUS | Status: DC | PRN

## 2019-08-15 MED ORDER — MEDROXYPROGESTERONE ACETATE 150 MG/ML IM SUSP: 150.0000 mg | INTRAMUSCULAR | Status: DC | PRN

## 2019-08-15 MED ORDER — SIMETHICONE 80 MG PO CHEW: 80.0000 mg | CHEWABLE_TABLET | ORAL | Status: DC | PRN

## 2019-08-15 MED ORDER — OXYTOCIN-SODIUM CHLORIDE 30-0.9 UT/500ML-% IV SOLN
1.0000 m[IU]/min | INTRAVENOUS | Status: DC
  Administered 2019-08-15: 2 m[IU]/min via INTRAVENOUS

## 2019-08-15 MED ORDER — SOD CITRATE-CITRIC ACID 500-334 MG/5ML PO SOLN: 30.0000 mL | ORAL | Status: DC | PRN

## 2019-08-15 MED ORDER — DIPHENHYDRAMINE HCL 25 MG PO CAPS: 25.0000 mg | ORAL_CAPSULE | Freq: Four times a day (QID) | ORAL | Status: DC | PRN

## 2019-08-15 MED ORDER — SENNOSIDES-DOCUSATE SODIUM 8.6-50 MG PO TABS
2.0000 | ORAL_TABLET | ORAL | Status: DC
  Administered 2019-08-15 – 2019-08-16 (×2): 2 via ORAL
  Filled 2019-08-15 (×2): qty 2

## 2019-08-15 MED ORDER — IBUPROFEN 600 MG PO TABS
600.0000 mg | ORAL_TABLET | Freq: Four times a day (QID) | ORAL | Status: DC
  Administered 2019-08-15 – 2019-08-17 (×7): 600 mg via ORAL
  Filled 2019-08-15 (×7): qty 1

## 2019-08-15 MED ORDER — EPHEDRINE 5 MG/ML INJ: 10.0000 mg | INTRAVENOUS | Status: DC | PRN

## 2019-08-15 MED ORDER — LIDOCAINE HCL (PF) 1 % IJ SOLN: 30.0000 mL | INTRAMUSCULAR | Status: DC | PRN

## 2019-08-15 MED ORDER — TERBUTALINE SULFATE 1 MG/ML IJ SOLN: 0.2500 mg | Freq: Once | INTRAMUSCULAR | Status: DC | PRN

## 2019-08-15 MED ORDER — FLEET ENEMA 7-19 GM/118ML RE ENEM: 1.0000 | ENEMA | RECTAL | Status: DC | PRN

## 2019-08-15 MED ORDER — ZOLPIDEM TARTRATE 5 MG PO TABS: 5.0000 mg | ORAL_TABLET | Freq: Every evening | ORAL | Status: DC | PRN

## 2019-08-15 MED ORDER — ACETAMINOPHEN 325 MG PO TABS
650.0000 mg | ORAL_TABLET | ORAL | Status: DC | PRN
  Administered 2019-08-15: 650 mg via ORAL
  Filled 2019-08-15: qty 2

## 2019-08-15 MED ORDER — FLEET ENEMA 7-19 GM/118ML RE ENEM: 1.0000 | ENEMA | Freq: Every day | RECTAL | Status: DC | PRN

## 2019-08-15 MED ORDER — ONDANSETRON HCL 4 MG/2ML IJ SOLN: 4.0000 mg | INTRAMUSCULAR | Status: DC | PRN

## 2019-08-15 MED ORDER — ONDANSETRON HCL 4 MG PO TABS: 4.0000 mg | ORAL_TABLET | ORAL | Status: DC | PRN

## 2019-08-15 MED ORDER — LIDOCAINE HCL (PF) 1 % IJ SOLN: INTRAMUSCULAR | Status: DC | PRN

## 2019-08-15 MED ORDER — WITCH HAZEL-GLYCERIN EX PADS: 1.0000 "application " | MEDICATED_PAD | CUTANEOUS | Status: DC | PRN

## 2019-08-15 MED ORDER — TETANUS-DIPHTH-ACELL PERTUSSIS 5-2.5-18.5 LF-MCG/0.5 IM SUSP: 0.5000 mL | Freq: Once | INTRAMUSCULAR | Status: DC

## 2019-08-15 MED ORDER — LACTATED RINGERS IV SOLN
500.0000 mL | Freq: Once | INTRAVENOUS | Status: AC
  Administered 2019-08-15: 500 mL via INTRAVENOUS

## 2019-08-15 MED ORDER — ONDANSETRON HCL 4 MG/2ML IJ SOLN
4.0000 mg | Freq: Four times a day (QID) | INTRAMUSCULAR | Status: DC | PRN
  Administered 2019-08-15: 4 mg via INTRAVENOUS
  Filled 2019-08-15: qty 2

## 2019-08-15 NOTE — Anesthesia Procedure Notes (Signed)
Epidural Patient location during procedure: OB Start time: 08/15/2019 1:20 PM End time: 08/15/2019 1:23 PM  Staffing Anesthesiologist: Leilani Able, MD Performed: anesthesiologist   Preanesthetic Checklist Completed: patient identified, IV checked, site marked, risks and benefits discussed, surgical consent, monitors and equipment checked, pre-op evaluation and timeout performed  Epidural Patient position: sitting Prep: DuraPrep and site prepped and draped Patient monitoring: continuous pulse ox and blood pressure Approach: midline Location: L3-L4 Injection technique: LOR air  Needle:  Needle type: Tuohy  Needle gauge: 17 G Needle length: 9 cm and 9 Needle insertion depth: 9 cm Catheter type: closed end flexible Catheter size: 19 Gauge Catheter at skin depth: 14 cm Test dose: negative and Other  Assessment Events: blood not aspirated, injection not painful, no injection resistance, no paresthesia and negative IV test  Additional Notes Reason for block:procedure for pain

## 2019-08-15 NOTE — MAU Note (Signed)
Pt reports to MAU c/o ctx every 3-5 min since 2300. No bleeding or LOF. +FM.

## 2019-08-15 NOTE — H&P (Signed)
Rebecca Adams is a 24 y.o. female  G2P0010 at a37.6 weeks Evaluated in MAU for worsening contractions and confirmed ROM, mec Denies vb, reports adequate fetal movement PT now in LDR, comfortable with epidural   OB History    Gravida  2   Para  0   Term  0   Preterm  0   AB  1   Living  0     SAB  0   TAB  1   Ectopic  0   Multiple  0   Live Births  0          Past Medical History:  Diagnosis Date  . Ankle sprain 08/2015  . Chlamydia 03/2016  . Gonorrhea 04/2017   gonorrhea positive 2020 also  . HSV-1 infection    Past Surgical History:  Procedure Laterality Date  . NO PAST SURGERIES     Family History: family history includes Breast cancer in her paternal grandmother; Hypertension in her mother. Social History:  reports that she has never smoked. She has never used smokeless tobacco. She reports previous drug use. Drug: Marijuana. She reports that she does not drink alcohol.     Maternal Diabetes: No Genetic Screening: Normal Maternal Ultrasounds/Referrals: Normal Fetal Ultrasounds or other Referrals:  None Maternal Substance Abuse:  No Significant Maternal Medications:  None Significant Maternal Lab Results:  Group Rebecca Strep negative Other Comments:  None  Review of Systems  Constitutional: Negative.   HENT: Negative.   Eyes: Negative.   Respiratory: Negative.   Cardiovascular: Negative.   Gastrointestinal: Negative.   Endocrine: Negative.   Genitourinary: Negative.   Musculoskeletal: Negative.   Skin: Negative.   Allergic/Immunologic: Negative.   Neurological: Negative.   Hematological: Negative.   Psychiatric/Behavioral: Negative.    Maternal Medical History:  Reason for admission: Rupture of membranes.   Contractions: Onset was 2 days ago.   Frequency: irregular.   Perceived severity is moderate.    Fetal activity: Perceived fetal activity is normal.      Dilation: 4 Effacement (%): 80 Station: -2 Exam by:: CNM  Rebecca Adams Blood pressure 123/74, pulse (!) 109, temperature 98.9 F (37.2 C), temperature source Oral, resp. rate 16, height 5\' 3"  (1.6 m), weight 90.9 kg, last menstrual period 11/21/2018, SpO2 100 %.  Bedside 11/23/2018 confirms vertex presentation  Maternal Exam:  Uterine Assessment: Contraction strength is mild.  Contraction frequency is irregular.   Abdomen: Patient reports no abdominal tenderness. Fetal presentation: vertex  Introitus: Normal vulva. Normal vagina.  Cervix: Cervix evaluated by digital exam.     Physical Exam  Vitals reviewed. Constitutional: She is oriented to person, place, and time.  HENT:  Head: Normocephalic and atraumatic.  Nose: Nose normal.  Mouth/Throat: Mucous membranes are moist.  Eyes: Pupils are equal, round, and reactive to light. Conjunctivae are normal.  Cardiovascular: Normal rate and regular rhythm.  Respiratory: Effort normal.  GI: Normal appearance and bowel sounds are normal.  Genitourinary:    Vulva normal.   Musculoskeletal:        General: Normal range of motion.     Cervical back: Normal range of motion and neck supple.  Neurological: She is alert and oriented to person, place, and time.  Skin: Skin is warm and dry.  Psychiatric: Her behavior is normal. Mood, judgment and thought content normal.    Prenatal labs: ABO, Rh: --/--/O POS, O POS Performed at [facility], [address] [place], [place]  [phone] 1114) Antibody:  NEG (06/15 1114) Rubella:  Immune RPR: NON REACTIVE (06/15 1125)  HBsAg:    Neg HIV: Non Reactive (08/10 1344)  GBS: Negative/-- (06/07 0000)   Assessment/Plan: Admitted to LDR Comfortable with epidural Begin pitocin augmentation as ordered Anticipate SVD   Rebecca Adams Rebecca Adams 08/15/2019, 2:01 PM

## 2019-08-15 NOTE — Discharge Instructions (Signed)

## 2019-08-15 NOTE — MAU Note (Signed)
Presents with ctxs every 3-4 minutes and LOF since approximately 30 minutes to 1 hour ago.  Reports fluid clear.  Denies VB.  Endorses +FM.

## 2019-08-15 NOTE — Anesthesia Preprocedure Evaluation (Signed)
Anesthesia Evaluation  Patient identified by MRN, date of birth, ID band Patient awake    Reviewed: Allergy & Precautions, H&P , NPO status , Patient's Chart, lab work & pertinent test results  Airway Mallampati: II  TM Distance: >3 FB Neck ROM: full    Dental no notable dental hx. (+) Teeth Intact   Pulmonary neg pulmonary ROS,    Pulmonary exam normal breath sounds clear to auscultation       Cardiovascular negative cardio ROS Normal cardiovascular exam Rhythm:regular Rate:Normal     Neuro/Psych negative neurological ROS  negative psych ROS   GI/Hepatic negative GI ROS, Neg liver ROS,   Endo/Other  negative endocrine ROS  Renal/GU negative Renal ROS     Musculoskeletal negative musculoskeletal ROS (+)   Abdominal (+) + obese,   Peds  Hematology negative hematology ROS (+)   Anesthesia Other Findings   Reproductive/Obstetrics (+) Pregnancy                             Anesthesia Physical Anesthesia Plan  ASA: II  Anesthesia Plan: Epidural   Post-op Pain Management:    Induction:   PONV Risk Score and Plan:   Airway Management Planned:   Additional Equipment:   Intra-op Plan:   Post-operative Plan:   Informed Consent: I have reviewed the patients History and Physical, chart, labs and discussed the procedure including the risks, benefits and alternatives for the proposed anesthesia with the patient or authorized representative who has indicated his/her understanding and acceptance.       Plan Discussed with:   Anesthesia Plan Comments:         Anesthesia Quick Evaluation

## 2019-08-16 LAB — CBC
HCT: 32.4 % — ABNORMAL LOW (ref 36.0–46.0)
Hemoglobin: 10.9 g/dL — ABNORMAL LOW (ref 12.0–15.0)
MCH: 32.3 pg (ref 26.0–34.0)
MCHC: 33.6 g/dL (ref 30.0–36.0)
MCV: 96.1 fL (ref 80.0–100.0)
Platelets: 189 10*3/uL (ref 150–400)
RBC: 3.37 MIL/uL — ABNORMAL LOW (ref 3.87–5.11)
RDW: 12.7 % (ref 11.5–15.5)
WBC: 25 10*3/uL — ABNORMAL HIGH (ref 4.0–10.5)
nRBC: 0 % (ref 0.0–0.2)

## 2019-08-16 NOTE — Progress Notes (Signed)
PPD# 1 SVD w/ intact perineum Information for the patient's newborn:  Rebecca, Barse Girl Adams [[id number]]  female     S:   Reports feeling good Tolerating PO fluid and solids No nausea or vomiting Bleeding is light, no clots Pain controlled with PO meds Up ad lib / ambulatory / voiding w/o difficulty Feeding: Breast    O:   VS: BP 106/60 (BP Location: Left Arm)   Pulse 95   Temp 98.4 F (36.9 C) (Oral)   Resp 18   Ht 5\' 3"  (1.6 m)   Wt 90.9 kg   LMP 11/21/2018 (Approximate)   SpO2 100%   Breastfeeding Unknown   BMI 35.50 kg/m   LABS:  Recent Labs    08/15/19 1125 08/16/19 0457  WBC 16.9* 25.0*  HGB 12.4 10.9*  PLT 199 189   Blood type: --/--/O POS, O POS Performed at [facility], [address], [place], [place]  [phone] 1114) Rubella:                        I&O: Intake/Output      06/15 0701 - 06/16 0700 06/16 0701 - 06/17 0700   Urine (mL/kg/hr) 700    Blood 300    Total Output 1000    Net -1000         Urine Occurrence 2 x      Physical Exam: Alert and oriented X3 Abdomen: soft, non-tender, non-distended  Fundus: firm, non-tender Perineum: intact Lochia: appropriate Extremities: trace edema, no calf pain or tenderness    A:  PPD # 1  Normal exam  P:  Routine post partum orders Anticipate D/C on 08/17/19   Plan reviewed w/ Dr. 08/19/19, MSN, CNM 08/16/2019, 11:04 AM

## 2019-08-16 NOTE — Anesthesia Postprocedure Evaluation (Signed)
Anesthesia Post Note  Patient: Rebecca Adams  Procedure(s) Performed: AN AD HOC LABOR EPIDURAL     Patient location during evaluation: Mother Baby Anesthesia Type: Epidural Level of consciousness: awake, awake and alert and oriented Pain management: pain level controlled Vital Signs Assessment: post-procedure vital signs reviewed and stable Respiratory status: spontaneous breathing, nonlabored ventilation and respiratory function stable Cardiovascular status: stable Postop Assessment: no headache, no backache, patient able to bend at knees, no apparent nausea or vomiting, adequate PO intake and able to ambulate Anesthetic complications: no   No complications documented.  Last Vitals:  Vitals:   08/16/19 0207 08/16/19 0544  BP: 104/62 112/80  Pulse: 94 (!) 113  Resp: 16 18  Temp: 37.2 C 36.9 C  SpO2: 99% 100%    Last Pain:  Vitals:   08/16/19 0544  TempSrc: Oral  PainSc: 0-No pain   Pain Goal:                   Camauri Craton

## 2019-08-16 NOTE — Lactation Note (Addendum)
This note was copied from a baby's chart. Lactation Consultation Note  Patient Name: Rebecca Adams BEEFE'O Date: 08/16/2019 Reason for consult: Follow-up assessment Mothers request/referral from RN Mom reports that her left nipple is sore and she has not been feeding or pumping on that breast because it is too painful.  Offered to assist with breastfeeding to try and make mom more comfortable.   Mom agreed.  Assisted with breastfeeding on right breast in cross cradle hold.  Infant let go content after close to 15 minutes. Mom attempted to burp her and and assisted mom with breastfeeding on the left.  Mom has areolar edema on left breast.  Mom reports she was given shells but does not have a bra to wear them with.  Showed mom how to do reverse pressure softening on the left breast and infant latched well and mom reports comfort. Infant fed close to 10 minutes on the left.  Urged mom just to pump on left breast since she had not been breastfeeding or pumping on that breast. Mom reports that her Motiff pump does not work as good as the hospital pump she is using.  Mom reports she is a [facility] participant.  Sent [facility] referral on moms behalf. Urged mom to read her instructions again to make sure using it correctly.   Praised breastfeeding.  Urged mom to call lactation as needed.  Maternal Data Has patient been taught Hand Expression?: Yes  Feeding Feeding Type: Breast Milk Nipple Type: Slow - flow  LATCH Score Latch: Grasps breast easily, tongue down, lips flanged, rhythmical sucking.  Audible Swallowing: A few with stimulation  Type of Nipple: Everted at rest and after stimulation  Comfort (Breast/Nipple): Filling, red/small blisters or bruises, mild/mod discomfort  Hold (Positioning): No assistance needed to correctly position infant at breast.  LATCH Score: 8  Interventions Interventions: Assisted with latch;Hand express;Position options;Expressed milk;DEBP  Lactation Tools  Discussed/Used     Consult Status Consult Status: Follow-up Date: 08/17/19 Follow-up type: In-patient    [facility] Michaelle Copas 08/16/2019, 9:56 PM

## 2019-08-16 NOTE — Lactation Note (Signed)
This note was copied from a baby's chart. Lactation Consultation Note  Patient Name: Rebecca Adams SWNIO'E Date: 08/16/2019 Reason for consult: Initial assessment;1st time breastfeeding P1, 6 hour ETI female infant. Per mom, she has Motiff DEBP at home and active on [facility] in [place]. Infant latched 1st time  in room for 10 minutes. LC entered room, mom latched infant on her right breast using the football hold, per mom, she felt slight pinch,  LC had mom break latch and re-latch infant, swallows could be heard and infant was in rhymathic suck-swallow- breathe pattern. Infant  was still breastfeeding after 20 minutes when LC left the room. Mom knows to call RN or [facility] for assistance with latch if needed. Mom knows to breastfeed infant according hunger cues, 8 to 12+ times within 24 hours and not exceed 3 hours without feeding infant. Mom will continue to do STS as much as possible. Reviewed Baby & Me book's Breastfeeding Basics.    Mom made aware of O/P services, breastfeeding support groups, community resources, and our phone # for post-discharge questions.   Maternal Data Formula Feeding for Exclusion: Yes Reason for exclusion: Mother's choice to formula and breast feed on admission Has patient been taught Hand Expression?: Yes Does the patient have breastfeeding experience prior to this delivery?: No  Feeding Feeding Type: Breast Fed  LATCH Score Latch: Grasps breast easily, tongue down, lips flanged, rhythmical sucking.  Audible Swallowing: Spontaneous and intermittent  Type of Nipple: Everted at rest and after stimulation  Comfort (Breast/Nipple): Soft / non-tender  Hold (Positioning): Assistance needed to correctly position infant at breast and maintain latch.  LATCH Score: 9  Interventions Interventions: Breast feeding basics reviewed;Breast compression;Assisted with latch;Adjust position;Skin to skin;Support pillows;Breast massage;Position options;Hand  express  Lactation Tools Discussed/Used WIC Program: Yes   Consult Status Consult Status: Follow-up Date: 08/16/19 Follow-up type: In-patient    Danelle Earthly 08/16/2019, 1:04 AM

## 2019-08-16 NOTE — Clinical Social Work Maternal (Signed)
CLINICAL SOCIAL WORK MATERNAL/CHILD NOTE  Patient Details  Name: MONSERRAT VIDAURRI MRN: [id number] Date of Birth: 1995/10/26  Date:  08/16/2019  Clinical Social Worker Initiating Note:  Hortencia Pilar, LCSW Date/Time: Initiated:  08/16/19/0900     Child's Name:  Weldon Picking   Biological Parents:  Mother Peony Barner)   Need for Interpreter:  None   Reason for Referral:  Current Substance Use/Substance Use During Pregnancy    Address:  [address] [place], [place] [address]   Phone number:  [phone] (home)     Additional phone number: none   Household Members/Support Persons (HM/SP):   Household Member/Support Person 1   HM/SP Name Relationship DOB or Age  HM/SP -1 Pria Groleau MOB     HM/SP -2   Natarsha Gudiel  MGM    HM/SP -3        HM/SP -4        HM/SP -5        HM/SP -6        HM/SP -7        HM/SP -8          Natural Supports (not living in the home):    none reported.   Professional Supports: None   Employment: Full-time   Type of Work: on leave from job due to pregnancy.   Education:  High school graduate   Homebound arranged:  n/a  Financial Resources:  Medicaid   Other Resources:  [facility]   Cultural/Religious Considerations Which May Impact Care:  none reported.   Strengths:  Ability to meet basic needs , Compliance with medical plan , Home prepared for child    Psychotropic Medications:       None reported to CSW.   Pediatrician:     not yet chosen   Pediatrician List:   [facility]      Pediatrician Fax Number:    Risk Factors/Current Problems:  None   Cognitive State:  Able to Concentrate , Insightful , Alert    Mood/Affect:  Relaxed , Comfortable , Calm , Irritable , Happy    CSW Assessment: CSW consulted as MOB reported THC use during pregnancy. CSW went to speak with MOB at bedside to address further needs.   CSW  congratulated MOB on the birth of infant. CSW noted that someone was lying on the couch in which MOB reported that this was her mother. CSW asked for permission to speak if MGM was to awake while CSW was in room, in which MOB was agreeable to. CSW understanding and advised MOB of the reason for CSW coming to visit with her. MOB reported that earlier in her pregnancy before it was confirmed she used THC "recereationally". MOB reported that she didn't use  any substances once she confirmed that she was pregnant. MOB reported that her last use was "a long time ago". CSW understanding and took time to advise MOB of the hospital drug screen policy. MOB was advised that if infants UDS or CDS results with any positives, then CSW wold need to make a CPS report. MOB reported that she understood and denied any use during pregnancy.   CSW inquired from [facility] on her mental health. MOB reports no mental health hx and reports no SI or HI. MOB also reports that she isn't involved in any DV. CSW inquired from [facility] on  who her supports were. MOB reported her mother is. CSW asked if FOB would be involved with infant and MOB reported that at this time, FOB is incarcerated. CSW understanding and asked MOB if she had all items to care for infant in which MOB reported that she does.   MOB was given PPD and SIDS education. MOB was given PPD Checklist in order to keep track of feelings as they relate to PPD. MOB reported no other needs to this CSW.   CSW will continue to monitor infants CDS and UDS and make CPS report of warranted.   CSW Plan/Description:  No Further Intervention Required/No Barriers to Discharge, Sudden Infant Death Syndrome (SIDS) Education, Perinatal Mood and Anxiety Disorder (PMADs) Education, CSW Will Continue to Monitor Umbilical Cord Tissue Drug Screen Results and Make Report if Warranted, [place], [place] 08/16/2019, 9:24 AM

## 2019-08-16 NOTE — Lactation Note (Addendum)
This note was copied from a baby's chart. Lactation Consultation Note  Patient Name: Rebecca Adams NIOEV'O Date: 08/16/2019 Reason for consult: Follow-up assessment  Mother is a P23, infant is 36 hours old  Mother paged for assistance and staff nurse sat up DEBP for her. Mother obtained 10 ml ebm.  Mother gave 10 ml to infant with a bottle.  Mother reports that her left nipple is sore. Staff nurse gave coconut oil  Observed a tiny red spot in the center of her left nipple  Reviewed hand expression with mother. Observed large drops of colostrum. Mother advised to apply on her nipple and allow to air dry.  Mother has a DEBP sat up at the bedside.   Mother to page when infant wakes up  for next feeding and   Breastfeed infant with feeding cues Discussed second night of life Supplement infant with ebm/ according to supplemental guidelines. Pump using a DEBP after each feeding for 15-20 mins.   Mother to continue to cue base feed infant and feed at least 8-12 times or more in 24 hours and advised to allow for cluster feeding infant as needed.   Mother to continue to due STS. Mother is aware of available LC services at [facility], BFSG'S, OP Dept, and phone # for questions or concerns about breastfeeding.  Mother receptive to all teaching and plan of care.   Maternal Data    Feeding Feeding Type: Breast Milk Nipple Type: Slow - flow  LATCH Score                   Interventions    Lactation Tools Discussed/Used Pump Review: Setup, frequency, and cleaning;Milk Storage Initiated by:: ER   of care.    Consult Status Consult Status: Follow-up Date: 08/16/19 Follow-up type: In-patient    Stevan Born [facility] 08/16/2019, 10:41 AM

## 2019-08-17 LAB — SURGICAL PATHOLOGY

## 2019-08-17 MED ORDER — IBUPROFEN 600 MG PO TABS: 600.0000 mg | ORAL_TABLET | Freq: Four times a day (QID) | ORAL | 0 refills | Status: DC

## 2019-08-17 NOTE — Lactation Note (Signed)
This note was copied from a baby's chart. Lactation Consultation Note  Patient Name: Girl Araceli Arango DGUYQ'I Date: 08/17/2019 Reason for consult: Follow-up assessment;Infant weight loss;Engorgement LC received report from the [facility] RN - mom has been able to pump off 80- ml,  And still experiencing engorgement.  As LC entered the room, LC noted a full bottle of EBM and mom shared the  Same story.  Baby has eaten in the last hour and sound asleep.  Mom eating her breakfast and LC offered to prepare 3 ice packs and instructed mom  To ice for 15 -20 mins with ice on the sides of of the breast where she is mostly engorged  And on top by lying down when icing.  After pumping 10 mins to call and LC will come back and see if baby can latch.  Mom mentioned the baby has been latching until her milk came in and she was so full.  LC will f/u with mom.   Maternal Data    Feeding Feeding Type:  (per mom baby recently fed) Nipple Type: Slow - flow  LATCH Score                   Interventions Interventions: Breast feeding basics reviewed;Ice  Lactation Tools Discussed/Used Tools: Pump Breast pump type: Double-Electric Breast Pump Pump Review: Setup, frequency, and cleaning   Consult Status Consult Status: Follow-up Date: 08/17/19 Follow-up type: In-patient    Matilde Sprang Jaquaveon Bilal 08/17/2019, 9:31 AM

## 2019-08-17 NOTE — Discharge Summary (Signed)
SVD OB Discharge Summary     Patient Name: Rebecca Adams DOB: April 06, 1995 MRN: [id number]  Date of admission: 08/15/2019 Delivering MD: Verdis Prime B  Date of delivery: 08/15/2019  Type of delivery: SVD  Newborn Data: Sex: Baby female Live born female  Birth Weight: 6 lb 9.5 oz (2991 g) APGAR: 8, 9  Newborn Delivery   Birth date/time: 08/15/2019 18:29:00 Delivery type: Vaginal, Spontaneous      Feeding: breast and bottle Infant being discharge to home with mother in stable condition.   Admitting diagnosis: Normal labor [O80, Z37.9] Intrauterine pregnancy: [redacted]w[redacted]d     Secondary diagnosis:  Active Problems:   Normal labor   SVD (6/15)   Normal postpartum course                                Complications: None                                                              Intrapartum Procedures: spontaneous vaginal delivery Postpartum Procedures: none Complications-Operative and Postpartum: none Augmentation: Pitocin   History of Present Illness: Ms. Rebecca Adams is a 24 y.o. female, G2P1011, who presents at [redacted]w[redacted]d weeks gestation. The patient has been followed at  [facility]  Her pregnancy has been complicated by:  Patient Active Problem List   Diagnosis Date Noted  . Normal postpartum course 08/17/2019  . SVD (6/15) 08/16/2019  . Normal labor 08/15/2019    Hospital course:  Onset of Labor With Vaginal Delivery      24 y.o. yo G2P1011 at [redacted]w[redacted]d was admitted in Latent Labor on 08/15/2019. Patient had an uncomplicated labor course as follows:  Membrane Rupture Time/Date: 9:30 AM ,08/15/2019   Delivery Method:Vaginal, Spontaneous  Episiotomy: None  Lacerations:  None  Patient had an uncomplicated postpartum course.  She is ambulating, tolerating a regular diet, passing flatus, and urinating well. Patient is discharged home in stable condition on 08/17/19.  Newborn Data: Birth date:08/15/2019  Birth time:6:29 PM   Gender:Female  Living status:Living  Apgars:8 ,9  Weight:2991 g  Postpartum Day # 2 : S/P NSVD due to spontaneous latent labor. Patient up ad lib, denies syncope or dizziness. Reports consuming regular diet without issues and denies N/V. Patient reports 0 bowel movement + passing flatus.  Denies issues with urination and reports bleeding is "lighter."  Patient is breastfeeding and reports going well.  Desires IUD for postpartum contraception.  Pain is being appropriately managed with use of po meds.    Physical exam  Vitals:   08/16/19 0930 08/16/19 1710 08/16/19 2305 08/17/19 0623  BP: 106/60 104/66 106/85 102/60  Pulse: 95 75 91 77  Resp: 18 19 18    Temp: 98.4 F (36.9 C) 98.6 F (37 C) 98 F (36.7 C) 98.2 F (36.8 C)  TempSrc: Oral Oral Oral   SpO2:   100%   Weight:      Height:       General: alert, cooperative and no distress Lochia: appropriate Uterine Fundus: firm Perineum: Intact DVT Evaluation: No evidence of DVT seen on physical exam. Negative Homan's sign. No cords or calf tenderness. No significant calf/ankle edema.  Labs: Lab Results  Component  Value Date   WBC 25.0 (H) 08/16/2019   HGB 10.9 (L) 08/16/2019   HCT 32.4 (L) 08/16/2019   MCV 96.1 08/16/2019   PLT 189 08/16/2019   CMP Latest Ref Rng & Units 03/17/2018  Glucose 65 - 99 mg/dL 71  BUN 6 - 20 mg/dL 9  Creatinine 0.57 - 1.00 mg/dL 0.91  Sodium 134 - 144 mmol/L 139  Potassium 3.5 - 5.2 mmol/L 4.0  Chloride 96 - 106 mmol/L 103  CO2 20 - 29 mmol/L 22  Calcium 8.7 - 10.2 mg/dL 9.2  Total Protein 6.0 - 8.5 g/dL 7.0  Total Bilirubin 0.0 - 1.2 mg/dL 0.3  Alkaline Phos 39 - 117 IU/L 70  AST 0 - 40 IU/L 17  ALT 0 - 32 IU/L 20    Date of discharge: 08/17/2019 Discharge Diagnoses: Term Pregnancy-delivered Discharge instruction: per After Visit Summary and "Baby and Me Booklet".  After visit meds:   Activity:           unrestricted and pelvic rest Advance as tolerated. Pelvic rest for 6 weeks.   Diet:                routine Medications: PNV and Ibuprofen Postpartum contraception: Undecided Condition:  Pt discharge to home with baby in stable condition   Meds: Allergies as of 08/17/2019   No Known Allergies     Medication List    STOP taking these medications   calcium carbonate 500 MG chewable tablet Commonly known as: TUMS - dosed in mg elemental calcium   valACYclovir 1000 MG tablet Commonly known as: Valtrex     TAKE these medications   ibuprofen 600 MG tablet Commonly known as: ADVIL Take 1 tablet (600 mg total) by mouth every 6 (six) hours.   PRENATAL VITAMINS PO Take 1 tablet by mouth daily.       Discharge Follow Up:   [facility] Follow up in 6 week(s).   Specialty: Obstetrics and Gynecology Contact information: [address] [place] [id number] [place], NP-C, [place] 08/17/2019, 12:32 PM  Noralyn Pick, [place]

## 2019-08-17 NOTE — Lactation Note (Signed)
This note was copied from a baby's chart. Lactation Consultation Note  Patient Name: Rebecca Adams Date: 08/17/2019 Reason for consult: (P) Follow-up assessment;Primapara;1st time breastfeeding;Engorgement;Infant weight loss;Other (Comment);Early term 37-38.6wks (5 % weight loss)  2nd LC visit this am. Per mom had gotten up for a shower and has been icing both breast and the breast aren't has warm.  LC assessed compressibility of of the areolas with moms permission to see if the baby could latch and the baby was awake.  Started on the left breast / football/ with assistance to obtain depth and baby fed for 12  mins with increased swallows with compressions.  LC set up the electric pump and on the other breast while the baby was feeding and was getting good results with relief of engorgement. Baby finished and there was still areas of both breast borderline engorged and swollen nodules.  Per mom needed to get up to the bathroom and will resume pumping both breast after bathroom.  LC will check on mom to see if engorgement resolved.  Per mom breast both feel much better after breast feeding and pumping.    Maternal Data    Feeding Feeding Type:  (per mom baby recently fed) Nipple Type: Slow - flow  LATCH Score                   Interventions Interventions: Breast feeding basics reviewed;Ice  Lactation Tools Discussed/Used Tools: Pump Breast pump type: Double-Electric Breast Pump Pump Review: Setup, frequency, and cleaning   Consult Status Consult Status: Follow-up Date: 08/17/19 Follow-up type: In-patient    Rebecca Adams 08/17/2019, 11:25 AM

## 2019-08-17 NOTE — Lactation Note (Signed)
This note was copied from a baby's chart. Lactation Consultation Note  Patient Name: Girl Jatia Musa TWSFK'C Date: 08/17/2019 Reason for consult: Follow-up assessment;Engorgement;Other (Comment) (resolving engorgement with assistance/ 3rd visit for this dyad/ see note) 3rd LC visit -  Mom followed up with pumping both breast and was able to get more engorgement relieved and her mom has been helping her.  As LC entered the room mom massage breast with coconut oil to both breast.  LC assessed breast and the upper portion on both breast are much improved and the  Hard areas have resolved.  The underneath portion of both breast are still firm.  LC assisted to show mom and grandmother reverse pressure breast massage to assist in lymph drainage and the front portions of both breast were softened.  LC assisted mom to 20 degree angle of the head of the bed and supported both breast with pillows and ice and in between breast and mom plans to take a nap and then feed the baby and pump if needed.  LC gave report to the [facility] that the engorgement needs to be totally resolved prior to discharge.   Maternal Data    Feeding Feeding Type:  (per mom baby recently fed)  LATCH Score                   Interventions Interventions: Breast feeding basics reviewed;Hand pump  Lactation Tools Discussed/Used Tools: Pump Breast pump type: Double-Electric Breast Pump Pump Review: Milk Storage   Consult Status Consult Status: Follow-up Date: 08/17/19 Follow-up type: In-patient    Matilde Sprang Cherylyn Sundby 08/17/2019, 12:42 PM

## 2019-08-17 NOTE — Lactation Note (Addendum)
This note was copied from a baby's chart. Lactation Consultation Note  Patient Name: Rebecca Adams AJOIN'O Date: 08/17/2019 Reason for consult: Follow-up assessment;Engorgement;1st time breastfeeding;Primapara;Other (Comment);Early term 37-38.6wks (resolving engorgement - see LC note)  4 th visit today,as LC entered the room baby latched on the right breast football and mom pumping on the left with a #24 F vertically with good EBM yield.  LC checked the the breast area under the areola and the tissue had softened. LC changed to a #27 F to increase follow. Per mom comfortable.  LC mentioned to mom to when her breast aren't has full to decrease to #24 F.  The only areas of the breast mom is working on getting engorgement resolved in the inner aspect of the breast , improving greatly compared to this am .  LC stressed the importance when her breast are not engorged to make sure she is offering the breast with feeding cues and by 3 hours to keep her breast are under control.  LC also stressed the importance if the baby only feeds the 1st breast to only relieve  The 2nd breast down to comfort.  LC praised mom for her consistent pumping and now re-latching the baby.  LC praised grandmother for her support.  LC mentioned allow the baby to be the pump at  The breast and the emotional response will be enhanced.  Mom did mention it felt good to latch the baby.  Per mom has a Motiff DEBP with 2 different size flanges.  Since the the engorgement is under control mom will do well with her DEBP.  LC provided the [facility] pamphlet with phone numbers.    LC reported to the [facility] regarding the engorgement resolving.    Maternal Data Has patient been taught Hand Expression?: Yes Does the patient have breastfeeding experience prior to this delivery?: No  Feeding Feeding Type:  (baby latched with depth on the right breast / football / breast softening)  LATCH Score Latch:  (latched with  depth)  Audible Swallowing:  (multiple swallows / increased with breast compressions / breast softening)     Comfort (Breast/Nipple):  (per mom comfortable)  Hold (Positioning):  (RN and grandmother assisted with latching)     Interventions Interventions: Breast feeding basics reviewed  Lactation Tools Discussed/Used Tools: Pump;Flanges Flange Size: 24;27 Breast pump type: Double-Electric Breast Pump;Manual WIC Program: Yes Pump Review: Milk Storage;Setup, frequency, and cleaning Initiated by:: MAI - reviewed Date initiated:: 08/17/19   Consult Status Consult Status: Complete Date: 08/17/19 Follow-up type: In-patient    Rebecca Adams 08/17/2019, 3:06 PM

## 2019-08-18 ENCOUNTER — Telehealth (HOSPITAL_COMMUNITY): Payer: Self-pay

## 2019-08-18 ENCOUNTER — Telehealth (HOSPITAL_COMMUNITY): Payer: Self-pay | Admitting: Lactation Services

## 2019-08-18 NOTE — Telephone Encounter (Signed)
LC return call to this  mom due to her phone call mentioning she is having problems with engorgement.  Mom was discharged yesterday afternoon 6/17 and this LC had worked with several times prior  To D/C due to severe engorgement and prior to D/C both breast engorgement had been resolved. Mom mentioned she went home was feeding her baby at the breast and had gotten to full and latching had become difficult again so she has a friend that let her use her DEBP Spectra that seemed to work better than her DEBP Motiff. Per mom has not latched or pumped since last night and both breast are just has engorged as yesterday morning.  LC stressed the importance of the baby can not latch its so important to pump down both breast for 15 -20 mins to protect her milk supply that she is establishing.  LC reviewed the [facility] plan for engorgement this am and recommended using cold cabbage leaves after rinsing them off and popping the veins of the cabbage , apply to both breast side a bra and frozen peas or corn on top of 15 -20 mins , then pump off enough to see if the baby can latch , if not pump both breast with the DEBP Spectra at the same time.  Mom mentioned she had chills when she woke up, no temperature or flu like symptoms.  LC recommended working on the [facility] plan above and once her engorgement is relieved the engorgement should improve.  LC also recommended for mom to call LC number back if needing more assistance.

## 2019-08-20 ENCOUNTER — Ambulatory Visit (HOSPITAL_COMMUNITY): Payer: Self-pay

## 2019-09-04 ENCOUNTER — Encounter (HOSPITAL_COMMUNITY): Payer: Medicaid Other

## 2019-10-20 DIAGNOSIS — Z309 Encounter for contraceptive management, unspecified: Secondary | ICD-10-CM | POA: Diagnosis not present

## 2019-11-23 DIAGNOSIS — Z20828 Contact with and (suspected) exposure to other viral communicable diseases: Secondary | ICD-10-CM | POA: Diagnosis not present

## 2019-12-20 ENCOUNTER — Encounter: Payer: Self-pay | Admitting: Psychiatry

## 2019-12-28 DIAGNOSIS — N939 Abnormal uterine and vaginal bleeding, unspecified: Secondary | ICD-10-CM | POA: Diagnosis not present

## 2020-08-06 ENCOUNTER — Ambulatory Visit (HOSPITAL_COMMUNITY): Payer: Self-pay

## 2020-08-07 ENCOUNTER — Ambulatory Visit (HOSPITAL_COMMUNITY)
Admission: EM | Admit: 2020-08-07 | Discharge: 2020-08-07 | Disposition: A | Discharge status: Home / Self Care | Payer: Medicaid Other | Attending: Family Medicine | Admitting: Family Medicine

## 2020-08-07 ENCOUNTER — Encounter (HOSPITAL_COMMUNITY): Payer: Self-pay

## 2020-08-07 ENCOUNTER — Other Ambulatory Visit: Payer: Self-pay

## 2020-08-07 DIAGNOSIS — N3090 Cystitis, unspecified without hematuria: Secondary | ICD-10-CM | POA: Insufficient documentation

## 2020-08-07 LAB — POCT URINALYSIS DIPSTICK, ED / UC
Bilirubin Urine: NEGATIVE
Glucose, UA: NEGATIVE mg/dL
Ketones, ur: NEGATIVE mg/dL
Nitrite: NEGATIVE
Protein, ur: 30 mg/dL — AB
Specific Gravity, Urine: 1.02 (ref 1.005–1.030)
Urobilinogen, UA: 0.2 mg/dL (ref 0.0–1.0)
pH: 7.5 (ref 5.0–8.0)

## 2020-08-07 MED ORDER — CEPHALEXIN 500 MG PO CAPS
500.0000 mg | ORAL_CAPSULE | Freq: Two times a day (BID) | ORAL | 0 refills | Status: DC
Start: 2020-08-07 — End: 2021-01-04

## 2020-08-07 NOTE — ED Provider Notes (Signed)
  [facility]    ASSESSMENT & PLAN:  1. Cystitis    Begin: Meds ordered this encounter  Medications  . cephALEXin (KEFLEX) 500 MG capsule    Sig: Take 1 capsule (500 mg total) by mouth 2 (two) times daily.    Dispense:  10 capsule    Refill:  0   No signs of pyelonephritis. Discussed. Work note provided. Urine culture sent. Will follow up with her PCP or here if not showing improvement over the next 48 hours, sooner if needed.  Outlined signs and symptoms indicating need for more acute intervention. Patient verbalized understanding. After Visit Summary given.  SUBJECTIVE:  Rebecca Adams is a 25 y.o. female who complains of urinary frequency, urgency and dysuria for the past few d. Without associated flank pain, fever, chills, vaginal discharge or bleeding. Gross hematuria: not present. No specific aggravating or alleviating factors reported. No LE edema. Normal PO intake without n/v/d. Without specific abdominal pain. Ambulatory without difficulty. OTC treatment: none. H/O UTI: infrequent.  LMP: No LMP recorded. (Menstrual status: Other).  OBJECTIVE:  Vitals:   08/07/20 1742 08/07/20 1746  BP:  125/79  Pulse:  90  Resp:  16  Temp:  98.1 F (36.7 C)  TempSrc:  Oral  SpO2:  98%  Weight: 88.5 kg   Height: 5\' 3"  (1.6 m)    General appearance: alert; no distress Lungs: unlabored respirations Abdomen: soft Back: no CVA tenderness Extremities: no edema; symmetrical with no gross deformities Skin: warm and dry Psychological: alert and cooperative; normal mood and affect  Labs Reviewed  POCT URINALYSIS DIPSTICK, ED / UC - Abnormal; Notable for the following components:      Result Value   Hgb urine dipstick MODERATE (*)    Protein, ur 30 (*)    Leukocytes,Ua MODERATE (*)    All other components within normal limits  URINE CULTURE    No Known Allergies  Past Medical History:  Diagnosis Date  . Ankle sprain 08/2015  . Chlamydia 03/2016  .  Gonorrhea 04/2017   gonorrhea positive 2020 also  . HSV-1 infection    Social History   Socioeconomic History  . Marital status: Single    Spouse name: Not on file  . Number of children: Not on file  . Years of education: Not on file  . Highest education level: Not on file  Occupational History  . Not on file  Tobacco Use  . Smoking status: Never Smoker  . Smokeless tobacco: Never Used  Vaping Use  . Vaping Use: Never used  Substance and Sexual Activity  . Alcohol use: No  . Drug use: Not Currently    Types: Marijuana  . Sexual activity: Yes    Partners: Male  Other Topics Concern  . Not on file  Social History Narrative  . Not on file   Social Determinants of Health   Financial Resource Strain: Not on file  Food Insecurity: Not on file  Transportation Needs: Not on file  Physical Activity: Not on file  Stress: Not on file  Social Connections: Not on file  Intimate Partner Violence: Not on file   Family History  Problem Relation Age of Onset  . Hypertension Mother   . Breast cancer Paternal 05/2017, MD 08/07/20 10/07/20

## 2020-08-07 NOTE — ED Triage Notes (Signed)
Patient states that she feels like she has a UTI because she has urinary frequency and a burning sensation when urinating.

## 2020-08-10 LAB — URINE CULTURE: Culture: >=100000 — AB

## 2020-08-31 ENCOUNTER — Emergency Department (HOSPITAL_COMMUNITY): Payer: Medicaid Other

## 2020-08-31 ENCOUNTER — Emergency Department (HOSPITAL_COMMUNITY)
Admission: EM | Admit: 2020-08-31 | Discharge: 2020-08-31 | Disposition: A | Discharge status: Home / Self Care | Payer: Medicaid Other | Attending: Emergency Medicine | Admitting: Emergency Medicine

## 2020-08-31 DIAGNOSIS — S60222A Contusion of left hand, initial encounter: Secondary | ICD-10-CM | POA: Insufficient documentation

## 2020-08-31 DIAGNOSIS — S0990XA Unspecified injury of head, initial encounter: Secondary | ICD-10-CM | POA: Diagnosis present

## 2020-08-31 DIAGNOSIS — Z23 Encounter for immunization: Secondary | ICD-10-CM | POA: Insufficient documentation

## 2020-08-31 DIAGNOSIS — S0101XA Laceration without foreign body of scalp, initial encounter: Secondary | ICD-10-CM | POA: Diagnosis not present

## 2020-08-31 MED ORDER — TETANUS-DIPHTH-ACELL PERTUSSIS 5-2.5-18.5 LF-MCG/0.5 IM SUSY
0.5000 mL | PREFILLED_SYRINGE | Freq: Once | INTRAMUSCULAR | Status: AC
  Administered 2020-08-31: 0.5 mL via INTRAMUSCULAR
  Filled 2020-08-31: qty 0.5

## 2020-08-31 MED ORDER — IBUPROFEN 200 MG PO TABS
600.0000 mg | ORAL_TABLET | Freq: Once | ORAL | Status: AC
  Administered 2020-08-31: 600 mg via ORAL
  Filled 2020-08-31: qty 3
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
## 2020-08-31 NOTE — Discharge Instructions (Addendum)
Return for any problem.   Staples placed today should be removed in 7-10 days.
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
## 2020-09-07 ENCOUNTER — Other Ambulatory Visit: Payer: Self-pay

## 2020-09-07 ENCOUNTER — Ambulatory Visit (HOSPITAL_COMMUNITY)
Admission: RE | Admit: 2020-09-07 | Discharge: 2020-09-07 | Disposition: A | Discharge status: Home / Self Care | Payer: Medicaid Other | Source: Ambulatory Visit

## 2020-09-07 DIAGNOSIS — Z4802 Encounter for removal of sutures: Secondary | ICD-10-CM | POA: Diagnosis not present

## 2020-09-07 NOTE — ED Notes (Signed)
2 sutures removed from posterior head

## 2020-09-07 NOTE — ED Triage Notes (Signed)
Pt in for suture removal  Denies any complications with site  No bleeding, wound looks well healed

## 2021-01-04 ENCOUNTER — Other Ambulatory Visit: Payer: Self-pay

## 2021-01-04 ENCOUNTER — Ambulatory Visit (HOSPITAL_COMMUNITY)
Admission: EM | Admit: 2021-01-04 | Discharge: 2021-01-04 | Disposition: A | Discharge status: Home / Self Care | Payer: Medicaid Other | Attending: Student | Admitting: Student

## 2021-01-04 ENCOUNTER — Encounter (HOSPITAL_COMMUNITY): Payer: Self-pay | Admitting: *Deleted

## 2021-01-04 DIAGNOSIS — N3001 Acute cystitis with hematuria: Secondary | ICD-10-CM | POA: Diagnosis not present

## 2021-01-04 DIAGNOSIS — Z789 Other specified health status: Secondary | ICD-10-CM | POA: Diagnosis not present

## 2021-01-04 LAB — POCT URINALYSIS DIPSTICK, ED / UC
Bilirubin Urine: NEGATIVE
Glucose, UA: NEGATIVE mg/dL
Ketones, ur: NEGATIVE mg/dL
Nitrite: NEGATIVE
Protein, ur: 100 mg/dL — AB
Specific Gravity, Urine: 1.02 (ref 1.005–1.030)
Urobilinogen, UA: 0.2 mg/dL (ref 0.0–1.0)
pH: 7 (ref 5.0–8.0)

## 2021-01-04 LAB — POC URINE PREG, ED: Preg Test, Ur: NEGATIVE

## 2021-01-04 MED ORDER — CEPHALEXIN 500 MG PO CAPS
500.0000 mg | ORAL_CAPSULE | Freq: Four times a day (QID) | ORAL | 0 refills | Status: DC
Start: 2021-01-04 — End: 2022-07-23

## 2021-01-04 NOTE — ED Triage Notes (Signed)
PT reports UTI Sx's

## 2021-01-04 NOTE — ED Provider Notes (Signed)
[place]    CSN: [id number] Arrival date & time: 01/04/21  1335      History   Chief Complaint Chief Complaint  Patient presents with   UTI    HPI Rebecca Adams is a 25 y.o. female presenting with urinary symptoms for 3 days.  Medical history UTI, chlamydia, gonorrhea.  Last UTI 5 months ago per patient.  Describes 3 days of suprapubic pressure, dysuria, urinary frequency.  Symptoms consistent with past UTI.  Denies vaginal symptoms like discharge. Nexplanon contraception.  HPI  Past Medical History:  Diagnosis Date   Ankle sprain 08/2015   Chlamydia 03/2016   Gonorrhea 04/2017   gonorrhea positive 2020 also   HSV-1 infection     Patient Active Problem List   Diagnosis Date Noted   Normal postpartum course 08/17/2019   SVD (6/15) 08/16/2019   Normal labor 08/15/2019    Past Surgical History:  Procedure Laterality Date   NO PAST SURGERIES      OB History     Gravida  2   Para  1   Term  1   Preterm  0   AB  1   Living  1      SAB  0   IAB  1   Ectopic  0   Multiple  0   Live Births  1            Home Medications    Prior to Admission medications   Medication Sig Start Date End Date Taking? Authorizing Provider  cephALEXin (KEFLEX) 500 MG capsule Take 1 capsule (500 mg total) by mouth 4 (four) times daily. 01/04/21  Yes Hazel Sams, PA-C  ibuprofen (ADVIL) 600 MG tablet Take 1 tablet (600 mg total) by mouth every 6 (six) hours. 08/17/19   Noralyn Pick, [place]  Prenatal Vit-Fe Fumarate-FA (PRENATAL VITAMINS PO) Take 1 tablet by mouth daily.    [provider]    Family History Family History  Problem Relation Age of Onset   Hypertension Mother    Breast cancer Paternal Grandmother     Social History Social History   Tobacco Use   Smoking status: Never   Smokeless tobacco: Never  Vaping Use   Vaping Use: Never used  Substance Use Topics   Alcohol use: No   Drug use: Not Currently    Types:  Marijuana     Allergies   Patient has no known allergies.   Review of Systems Review of Systems  Constitutional:  Negative for chills and fever.  HENT:  Negative for sore throat.   Eyes:  Negative for pain and redness.  Respiratory:  Negative for shortness of breath.   Cardiovascular:  Negative for chest pain.  Gastrointestinal:  Positive for abdominal pain. Negative for diarrhea, nausea and vomiting.  Genitourinary:  Positive for dysuria and frequency. Negative for decreased urine volume, difficulty urinating, flank pain, genital sores, hematuria, urgency, vaginal bleeding, vaginal discharge and vaginal pain.  Musculoskeletal:  Negative for back pain.  Skin:  Negative for rash.  All other systems reviewed and are negative.   Physical Exam Triage Vital Signs ED Triage Vitals  Enc Vitals Group     BP 01/04/21 1455 128/86     Pulse Rate 01/04/21 1455 86     Resp 01/04/21 1455 18     Temp 01/04/21 1455 97.7 F (36.5 C)     Temp src --      SpO2 01/04/21 1455 98 %  Weight --      Height --      Head Circumference --      Peak Flow --      Pain Score 01/04/21 1457 7     Pain Loc --      Pain Edu? --      Excl. in [place]? --    No data found.  Updated Vital Signs BP 128/86   Pulse 86   Temp 97.7 F (36.5 C)   Resp 18   SpO2 98%   Visual Acuity Right Eye Distance:   Left Eye Distance:   Bilateral Distance:    Right Eye Near:   Left Eye Near:    Bilateral Near:     Physical Exam Vitals reviewed.  Constitutional:      General: She is not in acute distress.    Appearance: Normal appearance. She is not ill-appearing.  HENT:     Head: Normocephalic and atraumatic.     Mouth/Throat:     Mouth: Mucous membranes are moist.     Comments: Moist mucous membranes Eyes:     Extraocular Movements: Extraocular movements intact.     Pupils: Pupils are equal, round, and reactive to light.  Cardiovascular:     Rate and Rhythm: Normal rate and regular rhythm.      Heart sounds: Normal heart sounds.  Pulmonary:     Effort: Pulmonary effort is normal.     Breath sounds: Normal breath sounds. No wheezing, rhonchi or rales.  Abdominal:     General: Bowel sounds are normal. There is no distension.     Palpations: Abdomen is soft. There is no mass.     Tenderness: There is abdominal tenderness in the suprapubic area. There is no right CVA tenderness, left CVA tenderness, guarding or rebound.  Skin:    General: Skin is warm.     Capillary Refill: Capillary refill takes less than 2 seconds.     Comments: Good skin turgor  Neurological:     General: No focal deficit present.     Mental Status: She is alert and oriented to person, place, and time.  Psychiatric:        Mood and Affect: Mood normal.        Behavior: Behavior normal.     UC Treatments / Results  Labs (all labs ordered are listed, but only abnormal results are displayed) Labs Reviewed  POCT URINALYSIS DIPSTICK, ED / UC - Abnormal; Notable for the following components:      Result Value   Hgb urine dipstick MODERATE (*)    Protein, ur 100 (*)    Leukocytes,Ua LARGE (*)    All other components within normal limits  URINE CULTURE  POC URINE PREG, ED    EKG   Radiology No results found.  Procedures Procedures (including critical care time)  Medications Ordered in UC Medications - No data to display  Initial Impression / Assessment and Plan / UC Course  I have reviewed the triage vital signs and the nursing notes.  Pertinent labs & imaging results that were available during my care of the patient were reviewed by me and considered in my medical decision making (see chart for details).     This patient is a very pleasant 25 y.o. year old female presenting with acute cystitis. Afebrile, nontachycardic, suprapubic pressure but no CVAT.  UA with moderate blood, large leuk; culture sent. She has not taken Azo.   Keflex as below.   ED  return precautions discussed. Patient  verbalizes understanding and agreement.      Final Clinical Impressions(s) / UC Diagnoses   Final diagnoses:  Acute cystitis with hematuria  Uses contraceptive implant for birth control     Discharge Instructions      -Start the antibiotic: Keflex, 4x daily x5 days. You can take this with food if you have a sensitive stomach. -Drink plenty of fluids  -We'll call in about 2 days if we need to change the antibiotic   ED Prescriptions     Medication Sig Dispense Auth. Provider   cephALEXin (KEFLEX) 500 MG capsule Take 1 capsule (500 mg total) by mouth 4 (four) times daily. 20 capsule Rhys Martini, PA-C      PDMP not reviewed this encounter.   Rhys Martini, PA-C 01/04/21 1545

## 2021-01-04 NOTE — Discharge Instructions (Addendum)
-  Start the antibiotic: Keflex, 4x daily x5 days. You can take this with food if you have a sensitive stomach. -Drink plenty of fluids  -We'll call in about 2 days if we need to change the antibiotic

## 2021-01-06 LAB — URINE CULTURE: Culture: >=100000 — AB

## 2022-01-13 ENCOUNTER — Ambulatory Visit: Payer: Self-pay

## 2022-02-24 ENCOUNTER — Ambulatory Visit
Admission: RE | Admit: 2022-02-24 | Discharge: 2022-02-24 | Disposition: A | Discharge status: Home / Self Care | Payer: Medicaid Other | Source: Ambulatory Visit | Attending: Family Medicine | Admitting: Family Medicine

## 2022-02-24 VITALS — BP 135/82 | HR 102 | Temp 98.5°F | Resp 18

## 2022-02-24 DIAGNOSIS — N939 Abnormal uterine and vaginal bleeding, unspecified: Secondary | ICD-10-CM | POA: Insufficient documentation

## 2022-02-24 DIAGNOSIS — R102 Pelvic and perineal pain: Secondary | ICD-10-CM | POA: Insufficient documentation

## 2022-02-24 DIAGNOSIS — Z113 Encounter for screening for infections with a predominantly sexual mode of transmission: Secondary | ICD-10-CM | POA: Insufficient documentation

## 2022-02-24 LAB — POCT URINALYSIS DIP (MANUAL ENTRY)
Bilirubin, UA: NEGATIVE
Glucose, UA: NEGATIVE mg/dL
Ketones, POC UA: NEGATIVE mg/dL
Leukocytes, UA: NEGATIVE
Nitrite, UA: NEGATIVE
Protein Ur, POC: 30 mg/dL — AB
Spec Grav, UA: 1.025 (ref 1.010–1.025)
Urobilinogen, UA: 0.2 E.U./dL
pH, UA: 7 (ref 5.0–8.0)

## 2022-02-24 LAB — POCT URINE PREGNANCY: Preg Test, Ur: NEGATIVE

## 2022-02-24 NOTE — Discharge Instructions (Signed)
Your urinalysis today did not show evidence of a urinary tract infection.  We have sent out a vaginal swab to rule out vaginal infections as well as HIV and syphilis blood work for further screening purposes.  Make sure to avoid any irritating products or scented soaps and follow-up with your OB/GYN if no infections or found on your testing and your symptoms persist

## 2022-02-24 NOTE — ED Triage Notes (Signed)
Partner told patient he was going to get testing for STD due to symptoms, and she wants to be checked, she is also having lower back pain and lower abdominal pain with new spotting.

## 2022-02-24 NOTE — ED Provider Notes (Signed)
[facility]    CSN: [id number] Arrival date & time: 02/24/22  1108      History   Chief Complaint Chief Complaint  Patient presents with   Exposure to STD    Entered by patient    HPI Rebecca Adams is a 26 y.o. female.   Patient presenting today requesting STD screening as her partner recently started with some symptoms and wanted to go get checked himself.  She is also having some suprapubic pressure, new vaginal spotting which she states is not typical for her on the implant birth control.  Denies fever, chills, abdominal pain, vaginal discharge, dysuria.  So far not trying anything for symptoms.    Past Medical History:  Diagnosis Date   Ankle sprain 08/2015   Chlamydia 03/2016   Gonorrhea 04/2017   gonorrhea positive 2020 also   HSV-1 infection     Patient Active Problem List   Diagnosis Date Noted   Normal postpartum course 08/17/2019   SVD (6/15) 08/16/2019   Normal labor 08/15/2019    Past Surgical History:  Procedure Laterality Date   NO PAST SURGERIES      OB History     Gravida  2   Para  1   Term  1   Preterm  0   AB  1   Living  1      SAB  0   IAB  1   Ectopic  0   Multiple  0   Live Births  1            Home Medications    Prior to Admission medications   Medication Sig Start Date End Date Taking? Authorizing Provider  cephALEXin (KEFLEX) 500 MG capsule Take 1 capsule (500 mg total) by mouth 4 (four) times daily. 01/04/21   Hazel Sams, PA-C  ibuprofen (ADVIL) 600 MG tablet Take 1 tablet (600 mg total) by mouth every 6 (six) hours. 08/17/19   Noralyn Pick, [place]  Prenatal Vit-Fe Fumarate-FA (PRENATAL VITAMINS PO) Take 1 tablet by mouth daily.    [provider]    Family History Family History  Problem Relation Age of Onset   Hypertension Mother    Breast cancer Paternal Grandmother     Social History Social History   Tobacco Use   Smoking status: Never   Smokeless tobacco: Never   Vaping Use   Vaping Use: Never used  Substance Use Topics   Alcohol use: No   Drug use: Not Currently    Types: Marijuana     Allergies   Patient has no known allergies.   Review of Systems Review of Systems Per HPI  Physical Exam Triage Vital Signs ED Triage Vitals [02/24/22 1145]  Enc Vitals Group     BP 135/82     Pulse Rate (!) 102     Resp 18     Temp 98.5 F (36.9 C)     Temp Source Oral     SpO2 97 %     Weight      Height      Head Circumference      Peak Flow      Pain Score 5     Pain Loc      Pain Edu?      Excl. in [place]?    No data found.  Updated Vital Signs BP 135/82 (BP Location: Right Arm)   Pulse (!) 102   Temp 98.5 F (36.9 C) (Oral)  Resp 18   SpO2 97%   Visual Acuity Right Eye Distance:   Left Eye Distance:   Bilateral Distance:    Right Eye Near:   Left Eye Near:    Bilateral Near:     Physical Exam Vitals and nursing note reviewed.  Constitutional:      Appearance: Normal appearance. She is not ill-appearing.  HENT:     Head: Atraumatic.     Mouth/Throat:     Mouth: Mucous membranes are moist.  Eyes:     Extraocular Movements: Extraocular movements intact.     Conjunctiva/sclera: Conjunctivae normal.  Cardiovascular:     Rate and Rhythm: Normal rate and regular rhythm.     Heart sounds: Normal heart sounds.  Pulmonary:     Effort: Pulmonary effort is normal.     Breath sounds: Normal breath sounds.  Abdominal:     General: Bowel sounds are normal. There is no distension.     Palpations: Abdomen is soft.     Tenderness: There is no abdominal tenderness. There is no right CVA tenderness, left CVA tenderness or guarding.  Genitourinary:    Comments: GU exam deferred, self swab performed Musculoskeletal:        General: Normal range of motion.     Cervical back: Normal range of motion and neck supple.  Skin:    General: Skin is warm and dry.  Neurological:     Mental Status: She is alert and oriented to person,  place, and time.  Psychiatric:        Mood and Affect: Mood normal.        Thought Content: Thought content normal.        Judgment: Judgment normal.      UC Treatments / Results  Labs (all labs ordered are listed, but only abnormal results are displayed) Labs Reviewed  POCT URINALYSIS DIP (MANUAL ENTRY) - Abnormal; Notable for the following components:      Result Value   Blood, UA large (*)    Protein Ur, POC =30 (*)    All other components within normal limits  RPR  HIV ANTIBODY (ROUTINE TESTING W REFLEX)  POCT URINE PREGNANCY  CERVICOVAGINAL ANCILLARY ONLY    EKG   Radiology No results found.  Procedures Procedures (including critical care time)  Medications Ordered in UC Medications - No data to display  Initial Impression / Assessment and Plan / UC Course  I have reviewed the triage vital signs and the nursing notes.  Pertinent labs & imaging results that were available during my care of the patient were reviewed by me and considered in my medical decision making (see chart for details).     Vitals and exam reassuring, urinalysis without evidence of a urinary tract infection, urine pregnancy negative.  Vaginal swab and HIV and syphilis labs pending for screening and further rule out.  Do suspect vaginal infection causing her symptoms today, will treat based on swab results and follow-up with OB/GYN if all negative. Final Clinical Impressions(s) / UC Diagnoses   Final diagnoses:  Routine screening for STI (sexually transmitted infection)  Suprapubic pressure  Vaginal spotting     Discharge Instructions      Your urinalysis today did not show evidence of a urinary tract infection.  We have sent out a vaginal swab to rule out vaginal infections as well as HIV and syphilis blood work for further screening purposes.  Make sure to avoid any irritating products or scented soaps and follow-up with  your OB/GYN if no infections or found on your testing and your  symptoms persist    ED Prescriptions   None    PDMP not reviewed this encounter.   Particia Nearing, [place] 02/24/22 1211

## 2022-02-25 LAB — CERVICOVAGINAL ANCILLARY ONLY
Bacterial Vaginitis (gardnerella): POSITIVE — AB
Candida Glabrata: NEGATIVE
Candida Vaginitis: POSITIVE — AB
Chlamydia: NEGATIVE
Comment: NEGATIVE
Comment: NEGATIVE
Comment: NEGATIVE
Comment: NEGATIVE
Comment: NEGATIVE
Comment: NORMAL
Neisseria Gonorrhea: POSITIVE — AB
Trichomonas: NEGATIVE

## 2022-02-25 LAB — RPR: RPR Ser Ql: NONREACTIVE

## 2022-02-25 LAB — HIV ANTIBODY (ROUTINE TESTING W REFLEX): HIV Screen 4th Generation wRfx: NONREACTIVE

## 2022-02-26 ENCOUNTER — Telehealth (HOSPITAL_COMMUNITY): Payer: Self-pay | Admitting: Emergency Medicine

## 2022-02-26 MED ORDER — METRONIDAZOLE 500 MG PO TABS: 500.0000 mg | ORAL_TABLET | Freq: Two times a day (BID) | ORAL | 0 refills | Status: DC

## 2022-02-26 MED ORDER — FLUCONAZOLE 150 MG PO TABS: 150.0000 mg | ORAL_TABLET | Freq: Once | ORAL | 0 refills | Status: AC

## 2022-02-26 NOTE — Telephone Encounter (Signed)
Results seen on mychart Per protocol, patient will need treatment with IM Rocephin 500mg  for positive Gonorrhea Will also need treatment with Metronidazole and Diflucan. Contacted patient by phone.  Verified identity using two identifiers.  Provided positive result.  Reviewed safe sex practices, notifying partners, and refraining from sexual activities for 7 days from time of treatment.  Patient verified understanding, all questions answered.   [facility]HS notified Verified pharmacy, prescription sent

## 2022-02-28 ENCOUNTER — Telehealth: Payer: Self-pay

## 2022-02-28 ENCOUNTER — Ambulatory Visit
Admission: EM | Admit: 2022-02-28 | Discharge: 2022-02-28 | Disposition: A | Discharge status: Home / Self Care | Payer: Medicaid Other | Attending: Internal Medicine | Admitting: Internal Medicine

## 2022-02-28 DIAGNOSIS — Z113 Encounter for screening for infections with a predominantly sexual mode of transmission: Secondary | ICD-10-CM | POA: Diagnosis not present

## 2022-02-28 MED ORDER — CEFTRIAXONE SODIUM 250 MG IJ SOLR
500.0000 mg | Freq: Once | INTRAMUSCULAR | Status: AC
  Administered 2022-02-28: 500 mg via INTRAMUSCULAR

## 2022-02-28 NOTE — ED Triage Notes (Signed)
Pt here for treatment for gonorrhea  

## 2022-04-15 ENCOUNTER — Ambulatory Visit
Admission: RE | Admit: 2022-04-15 | Discharge: 2022-04-15 | Disposition: A | Discharge status: Home / Self Care | Payer: Medicaid Other | Source: Ambulatory Visit | Attending: Family Medicine | Admitting: Family Medicine

## 2022-04-15 VITALS — BP 120/80 | HR 95 | Temp 99.0°F | Resp 20

## 2022-04-15 DIAGNOSIS — R058 Other specified cough: Secondary | ICD-10-CM | POA: Diagnosis not present

## 2022-04-15 DIAGNOSIS — Z1152 Encounter for screening for COVID-19: Secondary | ICD-10-CM | POA: Diagnosis not present

## 2022-04-15 DIAGNOSIS — J069 Acute upper respiratory infection, unspecified: Secondary | ICD-10-CM | POA: Insufficient documentation

## 2022-04-15 LAB — POCT INFLUENZA A/B
Influenza A, POC: NEGATIVE
Influenza B, POC: NEGATIVE

## 2022-04-15 MED ORDER — FLUTICASONE PROPIONATE 50 MCG/ACT NA SUSP: 1.0000 | Freq: Two times a day (BID) | NASAL | 2 refills | Status: DC

## 2022-04-15 MED ORDER — PROMETHAZINE-DM 6.25-15 MG/5ML PO SYRP: 5.0000 mL | ORAL_SOLUTION | Freq: Four times a day (QID) | ORAL | 0 refills | Status: DC | PRN

## 2022-04-15 NOTE — ED Provider Notes (Signed)
[facility]    CSN: [id number] Arrival date & time: 04/15/22  1440      History   Chief Complaint Chief Complaint  Patient presents with   Influenza    Don't feel good feel sick - Entered by patient    HPI Rebecca ARGENTO is a 27 y.o. female.   Patient presenting today with 2-day history of nasal congestion, cough, sneezing, fatigue, decreased appetite.  Denies fever, chills, chest pain, shortness of breath, abdominal pain, nausea vomiting or diarrhea.  So far not trying anything over-the-counter for symptoms.  No known sick contacts recently.    Past Medical History:  Diagnosis Date   Ankle sprain 08/2015   Chlamydia 03/2016   Gonorrhea 04/2017   gonorrhea positive 2020 also   HSV-1 infection     Patient Active Problem List   Diagnosis Date Noted   Normal postpartum course 08/17/2019   SVD (6/15) 08/16/2019   Normal labor 08/15/2019    Past Surgical History:  Procedure Laterality Date   NO PAST SURGERIES      OB History     Gravida  2   Para  1   Term  1   Preterm  0   AB  1   Living  1      SAB  0   IAB  1   Ectopic  0   Multiple  0   Live Births  1            Home Medications    Prior to Admission medications   Medication Sig Start Date End Date Taking? Authorizing Provider  fluticasone (FLONASE) 50 MCG/ACT nasal spray Place 1 spray into both nostrils 2 (two) times daily. 04/15/22  Yes Volney American, PA-C  promethazine-dextromethorphan (PROMETHAZINE-DM) 6.25-15 MG/5ML syrup Take 5 mLs by mouth 4 (four) times daily as needed. 04/15/22  Yes Volney American, PA-C  cephALEXin (KEFLEX) 500 MG capsule Take 1 capsule (500 mg total) by mouth 4 (four) times daily. 01/04/21   Hazel Sams, PA-C  ibuprofen (ADVIL) 600 MG tablet Take 1 tablet (600 mg total) by mouth every 6 (six) hours. 08/17/19   [place], Luvenia Starch, FNP  metroNIDAZOLE (FLAGYL) 500 MG tablet Take 1 tablet (500 mg total) by mouth 2 (two) times daily.  02/26/22   LampteyMyrene Galas, MD  Prenatal Vit-Fe Fumarate-FA (PRENATAL VITAMINS PO) Take 1 tablet by mouth daily.    [provider]    Family History Family History  Problem Relation Age of Onset   Hypertension Mother    Breast cancer Paternal Grandmother     Social History Social History   Tobacco Use   Smoking status: Never   Smokeless tobacco: Never  Vaping Use   Vaping Use: Never used  Substance Use Topics   Alcohol use: No   Drug use: Not Currently    Types: Marijuana     Allergies   Patient has no known allergies.   Review of Systems Review of Systems Per HPI  Physical Exam Triage Vital Signs ED Triage Vitals  Enc Vitals Group     BP 04/15/22 1454 120/80     Pulse Rate 04/15/22 1454 95     Resp 04/15/22 1454 20     Temp 04/15/22 1454 99 F (37.2 C)     Temp Source 04/15/22 1454 Oral     SpO2 04/15/22 1454 97 %     Weight --      Height --  Head Circumference --      Peak Flow --      Pain Score 04/15/22 1456 0     Pain Loc --      Pain Edu? --      Excl. in [place]? --    No data found.  Updated Vital Signs BP 120/80 (BP Location: Right Arm)   Pulse 95   Temp 99 F (37.2 C) (Oral)   Resp 20   SpO2 97%   Visual Acuity Right Eye Distance:   Left Eye Distance:   Bilateral Distance:    Right Eye Near:   Left Eye Near:    Bilateral Near:     Physical Exam Vitals and nursing note reviewed.  Constitutional:      Appearance: Normal appearance.  HENT:     Head: Atraumatic.     Right Ear: Tympanic membrane and external ear normal.     Left Ear: Tympanic membrane and external ear normal.     Nose: Rhinorrhea present.     Mouth/Throat:     Mouth: Mucous membranes are moist.     Pharynx: Posterior oropharyngeal erythema present.  Eyes:     Extraocular Movements: Extraocular movements intact.     Conjunctiva/sclera: Conjunctivae normal.  Cardiovascular:     Rate and Rhythm: Normal rate and regular rhythm.     Heart sounds:  Normal heart sounds.  Pulmonary:     Effort: Pulmonary effort is normal.     Breath sounds: Normal breath sounds. No wheezing or rales.  Musculoskeletal:        General: Normal range of motion.     Cervical back: Normal range of motion and neck supple.  Skin:    General: Skin is warm and dry.  Neurological:     Mental Status: She is alert and oriented to person, place, and time.  Psychiatric:        Mood and Affect: Mood normal.        Thought Content: Thought content normal.      UC Treatments / Results  Labs (all labs ordered are listed, but only abnormal results are displayed) Labs Reviewed  SARS CORONAVIRUS 2 (TAT 6-24 HRS)  POCT INFLUENZA A/B    EKG   Radiology No results found.  Procedures Procedures (including critical care time)  Medications Ordered in UC Medications - No data to display  Initial Impression / Assessment and Plan / UC Course  I have reviewed the triage vital signs and the nursing notes.  Pertinent labs & imaging results that were available during my care of the patient were reviewed by me and considered in my medical decision making (see chart for details).     Vital signs and exam overall reassuring and suggestive of a viral upper respiratory infection.  COVID test pending, rapid flu was negative.  Phenergan DM, Flonase, supportive over-the-counter medications and home care.  Return for worsening symptoms.  Final Clinical Impressions(s) / UC Diagnoses   Final diagnoses:  Viral URI with cough   Discharge Instructions   None    ED Prescriptions     Medication Sig Dispense Auth. Provider   promethazine-dextromethorphan (PROMETHAZINE-DM) 6.25-15 MG/5ML syrup Take 5 mLs by mouth 4 (four) times daily as needed. 100 mL Volney American, PA-C   fluticasone [facility]) 50 MCG/ACT nasal spray Place 1 spray into both nostrils 2 (two) times daily. 16 g Volney American, [place]      PDMP not reviewed this encounter.   Merrie Roof  Benjamine Mola, PA-C 04/15/22 1540

## 2022-04-15 NOTE — ED Triage Notes (Signed)
Pt reports she has mucus causing nauseas, coughing, sneezing, fatigue, loss of appetite x 2 days

## 2022-04-16 LAB — SARS CORONAVIRUS 2 (TAT 6-24 HRS): SARS Coronavirus 2: NEGATIVE

## 2022-06-11 ENCOUNTER — Ambulatory Visit: Payer: Self-pay

## 2022-07-23 ENCOUNTER — Ambulatory Visit
Admission: RE | Admit: 2022-07-23 | Discharge: 2022-07-23 | Disposition: A | Discharge status: Home / Self Care | Payer: Medicaid Other | Source: Ambulatory Visit | Attending: Nurse Practitioner | Admitting: Nurse Practitioner

## 2022-07-23 VITALS — BP 115/75 | HR 95 | Temp 98.4°F | Resp 20

## 2022-07-23 DIAGNOSIS — Z113 Encounter for screening for infections with a predominantly sexual mode of transmission: Secondary | ICD-10-CM | POA: Diagnosis present

## 2022-07-23 DIAGNOSIS — R197 Diarrhea, unspecified: Secondary | ICD-10-CM | POA: Diagnosis present

## 2022-07-23 MED ORDER — LOPERAMIDE HCL 2 MG PO CAPS: 2.0000 mg | ORAL_CAPSULE | Freq: Four times a day (QID) | ORAL | 0 refills | Status: DC | PRN

## 2022-07-23 NOTE — ED Triage Notes (Signed)
Pt reports she has been having runny bowel movements x 1 week. Also wants STD testing. No unprotected sexual intercourse x 3 months per patient. She also denies any sxs.

## 2022-07-23 NOTE — ED Provider Notes (Signed)
[facility]    CSN: [id number] Arrival date & time: 07/23/22  1247      History   Chief Complaint Chief Complaint  Patient presents with   Diarrhea    I also would like to get a checkup on a Std test - Entered by patient    HPI DAVEDA Adams is a 27 y.o. female.   Patient presents today with 1 week history of increased soft bowel movements.  Reports average of 4 nonbloody soft bowel movements per day which is a change for her.  Reports she is passing more gas and typically does not know if it is gas or bowel, so she has to hurry to the bathroom.  Patient denies abdominal pain, constipation, mucus in the stool, bloating, or nausea.  She did have 1 episode of vomiting earlier this week which made her think that it may have been a stomach bug initially.  No blood in the stool or blood in the vomit.  No new rash, jaundice, or fever, body aches, or chills.  Patient denies recent antibiotic use or recent change in medicines.  She reports she has implant for birth control.  No recent foreign travel or recent suspicious drinking water.  Reports she did go camping last weekend however they drink water from water bottles.  Has not take anything for bowel movement so far.  Patient is also requesting STI checkup today.  Denies known exposures to STI.  Reports she was exposed to gonorrhea in December and was treated appropriately.  She denies vaginal discharge, itching, skin changes to her genitalia area.  Denies groin swelling, abdominal pain, nausea/vomiting, and fevers.  No pelvic pain or urinary concerns today.  Patient declines HIV and syphilis testing today.    Past Medical History:  Diagnosis Date   Ankle sprain 08/2015   Chlamydia 03/2016   Gonorrhea 04/2017   gonorrhea positive 2020 also   HSV-1 infection     Patient Active Problem List   Diagnosis Date Noted   Normal postpartum course 08/17/2019   SVD (6/15) 08/16/2019   Normal labor 08/15/2019    Past Surgical  History:  Procedure Laterality Date   NO PAST SURGERIES      OB History     Gravida  2   Para  1   Term  1   Preterm  0   AB  1   Living  1      SAB  0   IAB  1   Ectopic  0   Multiple  0   Live Births  1            Home Medications    Prior to Admission medications   Medication Sig Start Date End Date Taking? Authorizing Provider  etonogestrel (NEXPLANON) 68 MG IMPL implant 1 each by Subdermal route once.   Yes [provider]  loperamide (IMODIUM) 2 MG capsule Take 1 capsule (2 mg total) by mouth 4 (four) times daily as needed for diarrhea or loose stools. 07/23/22  Yes Valentino Nose, NP  fluticasone (FLONASE) 50 MCG/ACT nasal spray Place 1 spray into both nostrils 2 (two) times daily. 04/15/22   Particia Nearing, PA-C  ibuprofen (ADVIL) 600 MG tablet Take 1 tablet (600 mg total) by mouth every 6 (six) hours. 08/17/19   Dale Beckett, FNP    Family History Family History  Problem Relation Age of Onset   Hypertension Mother    Breast cancer Paternal Grandmother  Social History Social History   Tobacco Use   Smoking status: Never   Smokeless tobacco: Never  Vaping Use   Vaping Use: Never used  Substance Use Topics   Alcohol use: No   Drug use: Not Currently    Types: Marijuana     Allergies   Patient has no known allergies.   Review of Systems Review of Systems Per HPI  Physical Exam Triage Vital Signs ED Triage Vitals [07/23/22 1300]  Enc Vitals Group     BP 115/75     Pulse Rate 95     Resp 20     Temp 98.4 F (36.9 C)     Temp Source Oral     SpO2 96 %     Weight      Height      Head Circumference      Peak Flow      Pain Score 0     Pain Loc      Pain Edu?      Excl. in GC?    No data found.  Updated Vital Signs BP 115/75 (BP Location: Right Arm)   Pulse 95   Temp 98.4 F (36.9 C) (Oral)   Resp 20   SpO2 96%   Breastfeeding No   Visual Acuity Right Eye Distance:   Left Eye  Distance:   Bilateral Distance:    Right Eye Near:   Left Eye Near:    Bilateral Near:     Physical Exam Vitals and nursing note reviewed.  Constitutional:      General: She is not in acute distress.    Appearance: Normal appearance. She is not toxic-appearing.  HENT:     Head: Normocephalic and atraumatic.     Mouth/Throat:     Mouth: Mucous membranes are moist.     Pharynx: Oropharynx is clear.  Eyes:     General: No scleral icterus.    Extraocular Movements: Extraocular movements intact.  Cardiovascular:     Rate and Rhythm: Normal rate and regular rhythm.  Pulmonary:     Effort: Pulmonary effort is normal. No respiratory distress.     Breath sounds: Normal breath sounds. No wheezing, rhonchi or rales.  Abdominal:     General: Abdomen is flat. Bowel sounds are normal. There is no distension.     Palpations: Abdomen is soft.     Tenderness: There is no abdominal tenderness. There is no right CVA tenderness, left CVA tenderness, guarding or rebound.  Genitourinary:    Comments: Deferred-self swab obtained Musculoskeletal:     Cervical back: Normal range of motion.  Lymphadenopathy:     Cervical: No cervical adenopathy.  Skin:    General: Skin is warm and dry.     Capillary Refill: Capillary refill takes less than 2 seconds.     Coloration: Skin is not jaundiced or pale.     Findings: No erythema.  Neurological:     Mental Status: She is alert and oriented to person, place, and time.  Psychiatric:        Behavior: Behavior is cooperative.      UC Treatments / Results  Labs (all labs ordered are listed, but only abnormal results are displayed) Labs Reviewed  CERVICOVAGINAL ANCILLARY ONLY    EKG   Radiology No results found.  Procedures Procedures (including critical care time)  Medications Ordered in UC Medications - No data to display  Initial Impression / Assessment and Plan / UC Course  I have  reviewed the triage vital signs and the nursing  notes.  Pertinent labs & imaging results that were available during my care of the patient were reviewed by me and considered in my medical decision making (see chart for details).   Patient is well-appearing, normotensive, afebrile, not tachycardic, not tachypneic, oxygenating well on room air.    1. Diarrhea, unspecified type Unclear etiology Does not appear to be true watery like bowel movements Treat with Imodium PCP assistance initiated today and recommended follow-up with PCP if loose bowel movements continue With any worsening symptoms including nausea/vomiting and unable to keep fluids down, blood in the stool, recommended ER evaluation  2. Screening examination for STD (sexually transmitted disease) Self swab is pending for trichomonas, gonorrhea, and chlamydia Patient denies known exposures or symptoms today Treat as indicated Patient declines HIV and syphilis testing today  The patient was given the opportunity to ask questions.  All questions answered to their satisfaction.  The patient is in agreement to this plan.   Final Clinical Impressions(s) / UC Diagnoses   Final diagnoses:  Diarrhea, unspecified type  Screening examination for STD (sexually transmitted disease)     Discharge Instructions      Start taking the Imodium to help with the diarrhea.  Make sure you are drinking plenty of fluids.  If symptoms do not improve with the Imodium, recommend following up with PCP.  We have initiated primary care assistance today and we will reach out with you and instructions on how to establish care with a PCP.  We will call you with any of the STD testing comes back positive from today.      ED Prescriptions     Medication Sig Dispense Auth. Provider   loperamide (IMODIUM) 2 MG capsule Take 1 capsule (2 mg total) by mouth 4 (four) times daily as needed for diarrhea or loose stools. 12 capsule Valentino Nose, NP      PDMP not reviewed this encounter.    Valentino Nose, NP 07/23/22 1349

## 2022-07-23 NOTE — Discharge Instructions (Signed)
Start taking the Imodium to help with the diarrhea.  Make sure you are drinking plenty of fluids.  If symptoms do not improve with the Imodium, recommend following up with PCP.  We have initiated primary care assistance today and we will reach out with you and instructions on how to establish care with a PCP.  We will call you with any of the STD testing comes back positive from today.

## 2022-07-24 LAB — CERVICOVAGINAL ANCILLARY ONLY
Bacterial Vaginitis (gardnerella): POSITIVE — AB
Candida Glabrata: NEGATIVE
Candida Vaginitis: NEGATIVE
Chlamydia: NEGATIVE
Comment: NEGATIVE
Comment: NEGATIVE
Comment: NEGATIVE
Comment: NEGATIVE
Comment: NEGATIVE
Comment: NORMAL
Neisseria Gonorrhea: NEGATIVE
Trichomonas: NEGATIVE

## 2022-07-28 ENCOUNTER — Telehealth (HOSPITAL_COMMUNITY): Payer: Self-pay | Admitting: Emergency Medicine

## 2022-07-28 MED ORDER — METRONIDAZOLE 500 MG PO TABS: 500.0000 mg | ORAL_TABLET | Freq: Two times a day (BID) | ORAL | 0 refills | Status: DC

## 2022-07-29 ENCOUNTER — Telehealth (HOSPITAL_COMMUNITY): Payer: Self-pay | Admitting: Emergency Medicine

## 2022-07-29 NOTE — Telephone Encounter (Signed)
Opened in error

## 2022-07-31 ENCOUNTER — Encounter (HOSPITAL_COMMUNITY): Payer: Self-pay

## 2022-10-07 ENCOUNTER — Ambulatory Visit
Admission: RE | Admit: 2022-10-07 | Discharge: 2022-10-07 | Disposition: A | Discharge status: Home / Self Care | Payer: Medicaid Other | Source: Ambulatory Visit | Attending: Nurse Practitioner | Admitting: Nurse Practitioner

## 2022-10-07 VITALS — BP 116/81 | HR 86 | Temp 99.4°F | Resp 16

## 2022-10-07 DIAGNOSIS — Z1152 Encounter for screening for COVID-19: Secondary | ICD-10-CM | POA: Diagnosis not present

## 2022-10-07 DIAGNOSIS — J029 Acute pharyngitis, unspecified: Secondary | ICD-10-CM

## 2022-10-07 DIAGNOSIS — J309 Allergic rhinitis, unspecified: Secondary | ICD-10-CM | POA: Diagnosis not present

## 2022-10-07 LAB — POCT RAPID STREP A (OFFICE): Rapid Strep A Screen: NEGATIVE

## 2022-10-07 NOTE — ED Provider Notes (Signed)
[facility]    CSN: [id number] Arrival date & time: 10/07/22  1303      History   Chief Complaint Chief Complaint  Patient presents with   Sore Throat    Entered by patient    HPI Rebecca Adams is a 27 y.o. female.   The history is provided by the patient.   The patient presents with a 2-day history of nasal congestion, sinus drainage, and sore throat.  Patient denies fever, chills, headache, ear drainage, ear pain, cough, chest pain, abdominal pain, nausea, vomiting, or diarrhea.  She states the throat pain "gets better then gets worse".  Patient denies history of seasonal allergies or asthma.  Reports that she took NyQuil last evening for symptoms.  Past Medical History:  Diagnosis Date   Ankle sprain 08/2015   Chlamydia 03/2016   Gonorrhea 04/2017   gonorrhea positive 2020 also   HSV-1 infection     Patient Active Problem List   Diagnosis Date Noted   Normal postpartum course 08/17/2019   SVD (6/15) 08/16/2019   Normal labor 08/15/2019    Past Surgical History:  Procedure Laterality Date   NO PAST SURGERIES      OB History     Gravida  2   Para  1   Term  1   Preterm  0   AB  1   Living  1      SAB  0   IAB  1   Ectopic  0   Multiple  0   Live Births  1            Home Medications    Prior to Admission medications   Medication Sig Start Date End Date Taking? Authorizing Provider  etonogestrel (NEXPLANON) 68 MG IMPL implant 1 each by Subdermal route once.    [provider]  fluticasone (FLONASE) 50 MCG/ACT nasal spray Place 1 spray into both nostrils 2 (two) times daily. 04/15/22   Particia Nearing, PA-C  ibuprofen (ADVIL) 600 MG tablet Take 1 tablet (600 mg total) by mouth every 6 (six) hours. 08/17/19   Dale Oneida, FNP  loperamide (IMODIUM) 2 MG capsule Take 1 capsule (2 mg total) by mouth 4 (four) times daily as needed for diarrhea or loose stools. 07/23/22   Valentino Nose, NP  metroNIDAZOLE  (FLAGYL) 500 MG tablet Take 1 tablet (500 mg total) by mouth 2 (two) times daily. 07/28/22   Lamptey, Britta Mccreedy, MD    Family History Family History  Problem Relation Age of Onset   Hypertension Mother    Breast cancer Paternal Grandmother     Social History Social History   Tobacco Use   Smoking status: Never   Smokeless tobacco: Never  Vaping Use   Vaping status: Never Used  Substance Use Topics   Alcohol use: No   Drug use: Not Currently    Types: Marijuana     Allergies   Patient has no known allergies.   Review of Systems Review of Systems Per HPI  Physical Exam Triage Vital Signs ED Triage Vitals  Encounter Vitals Group     BP 10/07/22 1316 116/81     Systolic BP Percentile --      Diastolic BP Percentile --      Pulse Rate 10/07/22 1316 86     Resp 10/07/22 1316 16     Temp 10/07/22 1316 99.4 F (37.4 C)     Temp Source 10/07/22 1316 Oral  SpO2 10/07/22 1316 98 %     Weight --      Height --      Head Circumference --      Peak Flow --      Pain Score 10/07/22 1320 3     Pain Loc --      Pain Education --      Exclude from Growth Chart --    No data found.  Updated Vital Signs BP 116/81 (BP Location: Right Arm)   Pulse 86   Temp 99.4 F (37.4 C) (Oral)   Resp 16   LMP  (LMP Unknown) Comment: pt does not have cycles with implant  SpO2 98%   Visual Acuity Right Eye Distance:   Left Eye Distance:   Bilateral Distance:    Right Eye Near:   Left Eye Near:    Bilateral Near:     Physical Exam Vitals and nursing note reviewed.  Constitutional:      General: She is not in acute distress.    Appearance: Normal appearance. She is well-developed.  HENT:     Head: Normocephalic.     Right Ear: Tympanic membrane and ear canal normal.     Left Ear: Tympanic membrane and ear canal normal.     Nose: Congestion present.     Mouth/Throat:     Mouth: Mucous membranes are moist. No oral lesions.     Pharynx: Posterior oropharyngeal erythema  present. No pharyngeal swelling, oropharyngeal exudate or uvula swelling.     Tonsils: No tonsillar exudate or tonsillar abscesses.     Comments: Cobblestoning present to posterior oropharynx Eyes:     Conjunctiva/sclera: Conjunctivae normal.     Pupils: Pupils are equal, round, and reactive to light.  Cardiovascular:     Rate and Rhythm: Normal rate and regular rhythm.     Pulses: Normal pulses.     Heart sounds: Normal heart sounds.  Pulmonary:     Effort: Pulmonary effort is normal. No respiratory distress.     Breath sounds: Normal breath sounds. No stridor. No wheezing, rhonchi or rales.  Abdominal:     General: Bowel sounds are normal.     Palpations: Abdomen is soft.     Tenderness: There is no abdominal tenderness.  Musculoskeletal:     Cervical back: Normal range of motion.  Lymphadenopathy:     Cervical: No cervical adenopathy.  Skin:    General: Skin is warm and dry.  Neurological:     General: No focal deficit present.     Mental Status: She is alert and oriented to person, place, and time.  Psychiatric:        Mood and Affect: Mood normal.        Behavior: Behavior normal.      UC Treatments / Results  Labs (all labs ordered are listed, but only abnormal results are displayed) Labs Reviewed  POCT RAPID STREP A (OFFICE)    EKG   Radiology No results found.  Procedures Procedures (including critical care time)  Medications Ordered in UC Medications - No data to display  Initial Impression / Assessment and Plan / UC Course  I have reviewed the triage vital signs and the nursing notes.  Pertinent labs & imaging results that were available during my care of the patient were reviewed by me and considered in my medical decision making (see chart for details).  The patient is well-appearing, she is in no acute distress, vital signs are stable.  Rapid  strep test was negative, throat culture and COVID test are pending.  The patient is a candidate to  receive Paxlovid if her COVID test is positive.  Suspect symptoms may be caused by allergic rhinitis.  Patient elects to purchase over-the-counter cetirizine for symptoms.  Supportive care recommendations were provided and discussed with the patient to include increasing fluids, allowing for plenty of rest, using over-the-counter nasal spray such as Flonase, and warm salt water gargles.  Patient advised to follow-up in this clinic or with her primary care physician if symptoms fail to improve.  Patient is in agreement with this plan of care and verbalizes understanding.  All questions were answered.  Patient stable for discharge.  Work note was provided.  Final Clinical Impressions(s) / UC Diagnoses   Final diagnoses:  Allergic rhinitis, unspecified seasonality, unspecified trigger  Sore throat  Encounter for screening for COVID-19     Discharge Instructions      The rapid strep test was negative.  A throat culture and COVID test are pending.  You will be contacted if the pending test results are positive. May use over-the-counter Zyrtec/cetirizine for current symptoms. May take over-the-counter Tylenol or ibuprofen as needed for pain, fever, general discomfort. Warm salt water gargles 3-4 times daily as needed for throat pain or discomfort.   Recommend a soft diet to include soup, broth, yogurt, pudding, or Jell-O. If symptoms fail to improve with this treatment, or if symptoms worsen, please follow-up in this clinic or with your primary care physician for further evaluation. Follow-up as needed.     ED Prescriptions   None    PDMP not reviewed this encounter.   Abran Cantor, NP 10/07/22 2013

## 2022-10-07 NOTE — ED Triage Notes (Signed)
Pt c/o sore throat, nasal congestion, sinus drainage  x 2 days

## 2022-10-07 NOTE — Discharge Instructions (Addendum)
The rapid strep test was negative.  A throat culture and COVID test are pending.  You will be contacted if the pending test results are positive. May use over-the-counter Zyrtec/cetirizine for current symptoms. May take over-the-counter Tylenol or ibuprofen as needed for pain, fever, general discomfort. Warm salt water gargles 3-4 times daily as needed for throat pain or discomfort.   Recommend a soft diet to include soup, broth, yogurt, pudding, or Jell-O. If symptoms fail to improve with this treatment, or if symptoms worsen, please follow-up in this clinic or with your primary care physician for further evaluation. Follow-up as needed.

## 2022-10-10 ENCOUNTER — Telehealth: Payer: Self-pay

## 2022-10-10 MED ORDER — PENICILLIN V POTASSIUM 500 MG PO TABS: 500.0000 mg | ORAL_TABLET | Freq: Two times a day (BID) | ORAL | 0 refills | Status: AC

## 2022-11-26 ENCOUNTER — Encounter: Payer: Self-pay | Admitting: Obstetrics & Gynecology

## 2022-11-26 ENCOUNTER — Ambulatory Visit: Payer: Medicaid Other | Admitting: Obstetrics & Gynecology

## 2022-11-26 VITALS — BP 111/74 | HR 92

## 2022-11-26 DIAGNOSIS — Z3046 Encounter for surveillance of implantable subdermal contraceptive: Secondary | ICD-10-CM

## 2022-11-26 NOTE — Progress Notes (Signed)
Procedure note: Removal of Nexplanon, been in for 3 years The left arm is prepped in usual sterile fashion Received 3 cc of half percent Marcaine is injected at the site of the Nexplanon insertion The Nexplanon is palpated without difficulty A small stab incision is made with an 11 blade Curved hemostats were used and the Nexplanon rod was removed without difficulty  Steri strips place Bandaged placed Leave in place for 48-72 hours  Lazaro Arms, MD 11/26/2022 10:12 AM

## 2022-12-03 ENCOUNTER — Ambulatory Visit: Payer: Medicaid Other | Admitting: Adult Health

## 2022-12-25 ENCOUNTER — Ambulatory Visit
Admission: RE | Admit: 2022-12-25 | Discharge: 2022-12-25 | Disposition: A | Discharge status: Home / Self Care | Payer: Medicaid Other | Source: Ambulatory Visit | Attending: Nurse Practitioner

## 2022-12-25 VITALS — BP 126/85 | HR 104 | Temp 98.7°F | Resp 18

## 2022-12-25 DIAGNOSIS — Z113 Encounter for screening for infections with a predominantly sexual mode of transmission: Secondary | ICD-10-CM | POA: Diagnosis present

## 2022-12-25 DIAGNOSIS — Z3202 Encounter for pregnancy test, result negative: Secondary | ICD-10-CM | POA: Diagnosis not present

## 2022-12-25 LAB — POCT URINE PREGNANCY: Preg Test, Ur: NEGATIVE

## 2022-12-25 NOTE — ED Provider Notes (Signed)
[facility]    CSN: [id number] Arrival date & time: 12/25/22  1309      History   Chief Complaint Chief Complaint  Patient presents with   Exposure to STD    I don't have any symptoms I just simply wanna get checked for anything as well as I been having a runny nose an itchy throat and wanna see if I possibly have a cold - Entered by patient    HPI Rebecca Adams is a 27 y.o. female.   Patient presents today for STD screening.  She denies any symptoms including no vaginal discharge, abdominal or pelvic pain, fever, nausea/vomiting.  No vaginal rashes, sores, or lesions.  Denies exposures to STI.  She has had unprotected sex recently.  She is also requesting a pregnancy test today.    Past Medical History:  Diagnosis Date   Ankle sprain 08/2015   Chlamydia 03/2016   Gonorrhea 04/2017   gonorrhea positive 2020 also   HSV-1 infection     Patient Active Problem List   Diagnosis Date Noted   Normal postpartum course 08/17/2019   SVD (6/15) 08/16/2019   Normal labor 08/15/2019    Past Surgical History:  Procedure Laterality Date   NO PAST SURGERIES      OB History     Gravida  2   Para  1   Term  1   Preterm  0   AB  1   Living  1      SAB  0   IAB  1   Ectopic  0   Multiple  0   Live Births  1            Home Medications    Prior to Admission medications   Medication Sig Start Date End Date Taking? Authorizing Provider  etonogestrel (NEXPLANON) 68 MG IMPL implant 1 each by Subdermal route once.    [provider]    Family History Family History  Problem Relation Age of Onset   Hypertension Mother    Breast cancer Paternal Grandmother     Social History Social History   Tobacco Use   Smoking status: Never   Smokeless tobacco: Never  Vaping Use   Vaping status: Never Used  Substance Use Topics   Alcohol use: No   Drug use: Not Currently    Types: Marijuana     Allergies   Patient has no known  allergies.   Review of Systems Review of Systems Per HPI  Physical Exam Triage Vital Signs ED Triage Vitals  Encounter Vitals Group     BP 12/25/22 1334 126/85     Systolic BP Percentile --      Diastolic BP Percentile --      Pulse Rate 12/25/22 1334 (!) 104     Resp 12/25/22 1334 18     Temp 12/25/22 1334 98.7 F (37.1 C)     Temp Source 12/25/22 1334 Oral     SpO2 12/25/22 1334 97 %     Weight --      Height --      Head Circumference --      Peak Flow --      Pain Score 12/25/22 1335 0     Pain Loc --      Pain Education --      Exclude from Growth Chart --    No data found.  Updated Vital Signs BP 126/85 (BP Location: Right Arm)  Pulse (!) 104   Temp 98.7 F (37.1 C) (Oral)   Resp 18   LMP 11/25/2022 (Approximate)   SpO2 97%   Visual Acuity Right Eye Distance:   Left Eye Distance:   Bilateral Distance:    Right Eye Near:   Left Eye Near:    Bilateral Near:     Physical Exam Vitals and nursing note reviewed.  Constitutional:      General: She is not in acute distress.    Appearance: Normal appearance. She is not toxic-appearing.  Pulmonary:     Effort: Pulmonary effort is normal. No respiratory distress.  Genitourinary:    Comments: Deferred-self swab performed by patient Skin:    General: Skin is warm and dry.     Coloration: Skin is not jaundiced or pale.     Findings: No erythema.  Neurological:     Mental Status: She is alert and oriented to person, place, and time.     Motor: No weakness.     Gait: Gait normal.  Psychiatric:        Mood and Affect: Mood normal.        Behavior: Behavior is cooperative.      UC Treatments / Results  Labs (all labs ordered are listed, but only abnormal results are displayed) Labs Reviewed  POCT URINE PREGNANCY  CERVICOVAGINAL ANCILLARY ONLY  CERVICOVAGINAL ANCILLARY ONLY    EKG   Radiology No results found.  Procedures Procedures (including critical care time)  Medications Ordered in  UC Medications - No data to display  Initial Impression / Assessment and Plan / UC Course  I have reviewed the triage vital signs and the nursing notes.  Pertinent labs & imaging results that were available during my care of the patient were reviewed by me and considered in my medical decision making (see chart for details).   Patient is well-appearing, normotensive, afebrile, not tachycardic, not tachypneic, oxygenating well on room air.    1. Screening for STD (sexually transmitted disease) Screening testing performed for gonorrhea, chlamydia, trichomonas Patient declines HIV and syphilis testing today Safe sex practices discussed and recommended condom use with every sexual encounter to prevent STI  2. Urine pregnancy test negative   The patient was given the opportunity to ask questions.  All questions answered to their satisfaction.  The patient is in agreement to this plan.    Final Clinical Impressions(s) / UC Diagnoses   Final diagnoses:  Screening for STD (sexually transmitted disease)  Urine pregnancy test negative     Discharge Instructions      We will contact you if the testing from today comes back positive.  Recommend condom use with every sexual counter to prevent STI.     ED Prescriptions   None    PDMP not reviewed this encounter.   Valentino Nose, NP 12/25/22 [id number]

## 2022-12-25 NOTE — ED Triage Notes (Signed)
Pt reports she wants to be tested for STDs + blood work but has no sxs. Same partner but unprotected    She want preg test due to getting off of BC end of sept.   Wanted to see if she had a fever due to daughter being sick.

## 2022-12-25 NOTE — Discharge Instructions (Addendum)
We will contact you if the testing from today comes back positive.  Recommend condom use with every sexual counter to prevent STI.

## 2022-12-28 LAB — CERVICOVAGINAL ANCILLARY ONLY
Chlamydia: POSITIVE — AB
Comment: NEGATIVE
Comment: NEGATIVE
Comment: NORMAL
Neisseria Gonorrhea: NEGATIVE
Trichomonas: NEGATIVE

## 2022-12-29 ENCOUNTER — Telehealth: Payer: Self-pay | Admitting: Emergency Medicine

## 2022-12-29 MED ORDER — DOXYCYCLINE HYCLATE 100 MG PO CAPS: 100.0000 mg | ORAL_CAPSULE | Freq: Two times a day (BID) | ORAL | 0 refills | Status: AC

## 2022-12-29 NOTE — Telephone Encounter (Signed)
Pt called and inquired about test coming back positive for chlamydia and next steps for treatment. Consulted provider and given verbal to electronically send over doxycycline 100 mg PO BID x7 days. Pt verified pharmacy of [facility] on [address].   Pt also reported "I am not familiar with medication." Discussed type of medication, need for back up birth control method if on pill, notify partners, and condom use with each sexual encounter. Pt verbalized understanding.

## 2023-01-12 ENCOUNTER — Encounter: Payer: Self-pay | Admitting: Adult Health

## 2023-01-12 ENCOUNTER — Other Ambulatory Visit (HOSPITAL_COMMUNITY)
Admission: RE | Admit: 2023-01-12 | Discharge: 2023-01-12 | Disposition: A | Discharge status: Home / Self Care | Payer: Medicaid Other | Source: Ambulatory Visit | Attending: Adult Health | Admitting: Adult Health

## 2023-01-12 ENCOUNTER — Ambulatory Visit: Payer: Medicaid Other | Admitting: Adult Health

## 2023-01-12 VITALS — BP 119/82 | HR 90 | Ht 64.0 in | Wt 243.0 lb

## 2023-01-12 DIAGNOSIS — Z01419 Encounter for gynecological examination (general) (routine) without abnormal findings: Secondary | ICD-10-CM

## 2023-01-12 DIAGNOSIS — Z1151 Encounter for screening for human papillomavirus (HPV): Secondary | ICD-10-CM | POA: Diagnosis not present

## 2023-01-12 DIAGNOSIS — Z1331 Encounter for screening for depression: Secondary | ICD-10-CM | POA: Diagnosis not present

## 2023-01-12 DIAGNOSIS — Z3202 Encounter for pregnancy test, result negative: Secondary | ICD-10-CM

## 2023-01-12 DIAGNOSIS — Z30013 Encounter for initial prescription of injectable contraceptive: Secondary | ICD-10-CM | POA: Diagnosis not present

## 2023-01-12 LAB — POCT URINE PREGNANCY: Preg Test, Ur: NEGATIVE

## 2023-01-12 MED ORDER — MEDROXYPROGESTERONE ACETATE 150 MG/ML IM SUSY
150.0000 mg | PREFILLED_SYRINGE | Freq: Once | INTRAMUSCULAR | Status: AC
  Administered 2023-01-12: 150 mg via INTRAMUSCULAR

## 2023-01-12 MED ORDER — MEDROXYPROGESTERONE ACETATE 150 MG/ML IM SUSP: 150.0000 mg | INTRAMUSCULAR | 4 refills | Status: DC

## 2023-01-12 NOTE — Progress Notes (Signed)
Patient ID: Rebecca Adams, female   DOB: 1995/08/26, 27 y.o.   MRN: [id number] History of Present Illness: Rebecca Adams is a 27 year old black female,single, G2P1011 in for a well woman gyn exam and pap and she wants depo. Last sex 1 weekago, but started period today.    Current Medications, Allergies, Past Medical History, Past Surgical History, Family History and Social History were reviewed in [employer] record.     Review of Systems: Patient denies any headaches, hearing loss, fatigue, blurred vision, shortness of breath, chest pain, abdominal pain, problems with bowel movements, urination, or intercourse. No joint pain or mood swings.  Wants to lose weight, to google phentermine and let me know if interested, will make appt    Physical Exam:BP 119/82 (BP Location: Left Arm, Patient Position: Sitting, Cuff Size: Large)   Pulse 90   Ht 5\' 4"  (1.626 m)   Wt 243 lb (110.2 kg)   LMP 01/12/2023 (Exact Date)   BMI 41.71 kg/m  UPT is negative  General:  Well developed, well nourished, no acute distress Skin:  Warm and dry Neck:  Midline trachea, normal thyroid, good ROM, no lymphadenopathy Lungs; Clear to auscultation bilaterally Breast:  No dominant palpable mass, retraction, or nipple discharge Cardiovascular: Regular rate and rhythm Abdomen:  Soft, non tender, no hepatosplenomegaly Pelvic:  External genitalia is normal in appearance, no lesions.  The vagina is normal in appearance,+blood. Urethra has no lesions or masses. The cervix is bulbous. Pap with GC/CHL and HR HPV genotyping performed. Uterus is felt to be normal size, shape, and contour.  No adnexal masses or tenderness noted.Bladder is non tender, no masses felt. Extremities/musculoskeletal:  No swelling or varicosities noted, no clubbing or cyanosis Psych:  No mood changes, alert and cooperative,seems happy AA is 4 Fall risk is low    01/12/2023    2:53 PM 10/01/2015    5:38 PM  Depression screen PHQ 2/9   Decreased Interest 1 0  Down, Depressed, Hopeless 1 0  PHQ - 2 Score 2 0  Altered sleeping 1   Tired, decreased energy 1   Change in appetite 0   Feeling bad or failure about yourself  0   Trouble concentrating 0   Moving slowly or fidgety/restless 0   Suicidal thoughts 0   PHQ-9 Score 4        01/12/2023    2:53 PM  GAD 7 : Generalized Anxiety Score  Nervous, Anxious, on Edge 0  Control/stop worrying 0  Worry too much - different things 1  Trouble relaxing 1  Restless 0  Easily annoyed or irritable 1  Afraid - awful might happen 0  Total GAD 7 Score 3      Upstream - 01/12/23 1450       Pregnancy Intention Screening   Does the patient want to become pregnant in the next year? No    Does the patient's partner want to become pregnant in the next year? No    Would the patient like to discuss contraceptive options today? Yes      Contraception Wrap Up   Current Method No Contraceptive Precautions    End Method Hormonal Injection    Contraception Counseling Provided Yes    How was the end contraceptive method provided? Prescription   first injection in office           Examination chaperoned by Rebecca Apley RN  Impression and plan: 1. Encounter for routine gynecological examination with Papanicolaou  smear of cervix Pap sent Pap in 3 years if negative Physical in 1 year  2. Encounter for initial prescription of injectable contraceptive UPT negative First injection in office and rx sent in for depo Meds ordered this encounter  Medications   medroxyPROGESTERone (DEPO-PROVERA) 150 MG/ML injection    Sig: Inject 1 mL (150 mg total) into the muscle every 3 (three) months.    Dispense:  1 mL    Refill:  4    Order Specific Question:   Supervising Provider    Answer:   Duane Lope H [[id number]]    Return in 12 weeks for next depo Use condoms for 2 weeks

## 2023-01-12 NOTE — Addendum Note (Signed)
Addended by: Caralyn Guile on: 01/12/2023 04:30 PM   Modules accepted: Orders

## 2023-01-12 NOTE — Patient Instructions (Signed)
Use condoms for 2 weeks Depo in 12 weejs

## 2023-01-15 LAB — CYTOLOGY - PAP
Adequacy: ABSENT
Chlamydia: NEGATIVE
Comment: NEGATIVE
Comment: NEGATIVE
Comment: NORMAL
Diagnosis: NEGATIVE
High risk HPV: NEGATIVE
Neisseria Gonorrhea: NEGATIVE

## 2023-02-04 ENCOUNTER — Other Ambulatory Visit (HOSPITAL_COMMUNITY)
Admission: RE | Admit: 2023-02-04 | Discharge: 2023-02-04 | Disposition: A | Discharge status: Home / Self Care | Payer: Medicaid Other | Source: Ambulatory Visit | Attending: Adult Health | Admitting: Adult Health

## 2023-02-04 ENCOUNTER — Ambulatory Visit: Payer: Medicaid Other | Admitting: Adult Health

## 2023-02-04 ENCOUNTER — Encounter: Payer: Self-pay | Admitting: Adult Health

## 2023-02-04 VITALS — BP 118/74 | HR 89 | Ht 64.0 in | Wt 243.5 lb

## 2023-02-04 DIAGNOSIS — Z113 Encounter for screening for infections with a predominantly sexual mode of transmission: Secondary | ICD-10-CM | POA: Insufficient documentation

## 2023-02-04 DIAGNOSIS — Z3202 Encounter for pregnancy test, result negative: Secondary | ICD-10-CM | POA: Diagnosis not present

## 2023-02-04 DIAGNOSIS — N923 Ovulation bleeding: Secondary | ICD-10-CM | POA: Diagnosis present

## 2023-02-04 LAB — POCT URINE PREGNANCY: Preg Test, Ur: NEGATIVE

## 2023-02-04 NOTE — Progress Notes (Signed)
  Subjective:     Patient ID: Rebecca Adams, female   DOB: April 15, 1995, 27 y.o.   MRN: [id number]  HPI Rebecca Adams is a 27 year old black female,single, G2P1011, in complaining of spotting, she started depo 01/12/23 and has had new partner and did not use a condom.     Component Value Date/Time   DIAGPAP  01/12/2023 1629    - Negative for intraepithelial lesion or malignancy (NILM)   DIAGPAP  03/17/2018 0000    NEGATIVE FOR INTRAEPITHELIAL LESIONS OR MALIGNANCY.   HPVHIGH Negative 01/12/2023 1629   ADEQPAP  01/12/2023 1629    Satisfactory for evaluation; transformation zone component ABSENT.   ADEQPAP  03/17/2018 0000    Satisfactory for evaluation  endocervical/transformation zone component PRESENT.   Review of Systems Spotting between periods, had for 2 days none now Reviewed past medical,surgical, social and family history. Reviewed medications and allergies.     Objective:   Physical Exam BP 118/74 (BP Location: Left Arm, Patient Position: Sitting, Cuff Size: Large)   Pulse 89   Ht 5\' 4"  (1.626 m)   Wt 243 lb 8 oz (110.5 kg)   LMP 01/12/2023 (Exact Date)   BMI 41.80 kg/m  UPT is negative Skin warm and dry.Pelvic: external genitalia is normal in appearance no lesions, vagina: scant discharge without odor, no blood,urethra has no lesions or masses noted, cervix:smooth and bulbous, uterus: normal size, shape and contour, non tender, no masses felt, adnexa: no masses or tenderness noted. Bladder is non tender and no masses felt. CV swab obtained.  Upstream - 02/04/23 0945       Pregnancy Intention Screening   Does the patient want to become pregnant in the next year? No    Does the patient's partner want to become pregnant in the next year? No    Would the patient like to discuss contraceptive options today? No      Contraception Wrap Up   Current Method Hormonal Injection    End Method Hormonal Injection    Contraception Counseling Provided Yes               Examination  chaperoned by Malachy Mood LPN  Assessment:     1. Pregnancy examination or test, negative result - POCT urine pregnancy  2. Intermenstrual bleeding Spotted for 2 days, none now Will watch for now, since not heavy CV swab sent  - Cervicovaginal ancillary only( [place])  3. Screening examination for STD (sexually transmitted disease) CV swab sent for GC/CHL,trich,BV and yeast  Will check HIV and RPR - Cervicovaginal ancillary only( [place]) - HIV Antibody (routine testing w rflx) - RPR     Plan:     Follow up prn

## 2023-02-05 LAB — CERVICOVAGINAL ANCILLARY ONLY
Bacterial Vaginitis (gardnerella): POSITIVE — AB
Candida Glabrata: NEGATIVE
Candida Vaginitis: NEGATIVE
Chlamydia: NEGATIVE
Comment: NEGATIVE
Comment: NEGATIVE
Comment: NEGATIVE
Comment: NEGATIVE
Comment: NEGATIVE
Comment: NORMAL
Neisseria Gonorrhea: NEGATIVE
Trichomonas: NEGATIVE

## 2023-02-05 LAB — HIV ANTIBODY (ROUTINE TESTING W REFLEX): HIV Screen 4th Generation wRfx: NONREACTIVE

## 2023-02-05 LAB — RPR: RPR Ser Ql: NONREACTIVE

## 2023-02-08 ENCOUNTER — Other Ambulatory Visit: Payer: Self-pay | Admitting: Adult Health

## 2023-02-08 MED ORDER — METRONIDAZOLE 500 MG PO TABS: 500.0000 mg | ORAL_TABLET | Freq: Two times a day (BID) | ORAL | 0 refills | Status: DC

## 2023-02-08 NOTE — Progress Notes (Signed)
+  BV on vaginal swab will rx flagyl, no sex or alcohol while taking meds.  

## 2023-03-03 HISTORY — PX: WISDOM TOOTH EXTRACTION: SHX21

## 2023-03-23 ENCOUNTER — Ambulatory Visit: Payer: Medicaid Other

## 2023-03-30 ENCOUNTER — Ambulatory Visit
Admission: RE | Admit: 2023-03-30 | Discharge: 2023-03-30 | Disposition: A | Discharge status: Home / Self Care | Payer: Medicaid Other | Source: Ambulatory Visit | Attending: Nurse Practitioner | Admitting: Nurse Practitioner

## 2023-03-30 VITALS — BP 128/82 | HR 85 | Temp 99.0°F | Resp 18

## 2023-03-30 DIAGNOSIS — R399 Unspecified symptoms and signs involving the genitourinary system: Secondary | ICD-10-CM | POA: Diagnosis present

## 2023-03-30 LAB — POCT URINALYSIS DIP (MANUAL ENTRY)
Bilirubin, UA: NEGATIVE
Glucose, UA: NEGATIVE mg/dL
Ketones, POC UA: NEGATIVE mg/dL
Nitrite, UA: NEGATIVE
Protein Ur, POC: NEGATIVE mg/dL
Spec Grav, UA: 1.02 (ref 1.010–1.025)
Urobilinogen, UA: 0.2 U/dL
pH, UA: 6.5 (ref 5.0–8.0)

## 2023-03-30 MED ORDER — NITROFURANTOIN MONOHYD MACRO 100 MG PO CAPS: 100.0000 mg | ORAL_CAPSULE | Freq: Two times a day (BID) | ORAL | 0 refills | Status: DC

## 2023-03-30 NOTE — ED Provider Notes (Signed)
[facility]    CSN: [id number] Arrival date & time: 03/30/23  0950      History   Chief Complaint Chief Complaint  Patient presents with   Dysuria    HPI Rebecca Adams is a 28 y.o. female.   The history is provided by the patient.   Patient presents for complaints of pain with urination, along with urinary frequency, urgency, over the past week.  Patient denies fever, chills, chest pain, abdominal pain, decreased urine stream, flank pain, low back pain, or vaginal symptoms.  Patient denies prior history of pyelonephritis, kidney stones, or recurrent UTIs.  Patient states she is not concerned for STI/STD at this time.  Past Medical History:  Diagnosis Date   Ankle sprain 08/2015   Chlamydia 03/2016   Gonorrhea 04/2017   gonorrhea positive 2020 also   HSV-1 infection     Patient Active Problem List   Diagnosis Date Noted   Screening examination for STD (sexually transmitted disease) 02/04/2023   Intermenstrual bleeding 02/04/2023   Pregnancy examination or test, negative result 02/04/2023   Normal postpartum course 08/17/2019   SVD (6/15) 08/16/2019   Normal labor 08/15/2019    Past Surgical History:  Procedure Laterality Date   NO PAST SURGERIES      OB History     Gravida  2   Para  1   Term  1   Preterm  0   AB  1   Living  1      SAB  0   IAB  1   Ectopic  0   Multiple  0   Live Births  1            Home Medications    Prior to Admission medications   Medication Sig Start Date End Date Taking? Authorizing Provider  nitrofurantoin, macrocrystal-monohydrate, (MACROBID) 100 MG capsule Take 1 capsule (100 mg total) by mouth 2 (two) times daily. 03/30/23  Yes Leath-Warren, Sadie Haber, NP  medroxyPROGESTERone (DEPO-PROVERA) 150 MG/ML injection Inject 1 mL (150 mg total) into the muscle every 3 (three) months. 01/12/23   Adline Potter, NP    Family History Family History  Problem Relation Age of Onset   Breast  cancer Paternal Grandmother    Hypertension Mother     Social History Social History   Tobacco Use   Smoking status: Every Day    Current packs/day: 2.00    Types: Cigarettes   Smokeless tobacco: Never  Vaping Use   Vaping status: Never Used  Substance Use Topics   Alcohol use: Yes    Alcohol/week: 1.0 standard drink of alcohol    Types: 1 Glasses of wine per week    Comment: every 2 weeks   Drug use: Not Currently    Types: Marijuana     Allergies   Patient has no known allergies.   Review of Systems Review of Systems Per HPI  Physical Exam Triage Vital Signs ED Triage Vitals  Encounter Vitals Group     BP 03/30/23 1008 128/82     Systolic BP Percentile --      Diastolic BP Percentile --      Pulse Rate 03/30/23 1008 85     Resp 03/30/23 1008 18     Temp 03/30/23 1008 99 F (37.2 C)     Temp Source 03/30/23 1008 Oral     SpO2 03/30/23 1008 98 %     Weight --      Height --  Head Circumference --      Peak Flow --      Pain Score 03/30/23 1009 0     Pain Loc --      Pain Education --      Exclude from Growth Chart --    No data found.  Updated Vital Signs BP 128/82 (BP Location: Right Arm)   Pulse 85   Temp 99 F (37.2 C) (Oral)   Resp 18   SpO2 98%   Visual Acuity Right Eye Distance:   Left Eye Distance:   Bilateral Distance:    Right Eye Near:   Left Eye Near:    Bilateral Near:     Physical Exam Vitals and nursing note reviewed.  Constitutional:      General: She is not in acute distress.    Appearance: Normal appearance.  HENT:     Head: Normocephalic.  Eyes:     Extraocular Movements: Extraocular movements intact.     Conjunctiva/sclera: Conjunctivae normal.     Pupils: Pupils are equal, round, and reactive to light.  Cardiovascular:     Rate and Rhythm: Regular rhythm.     Pulses: Normal pulses.     Heart sounds: Normal heart sounds.  Pulmonary:     Effort: Pulmonary effort is normal. No respiratory distress.      Breath sounds: Normal breath sounds. No stridor. No wheezing, rhonchi or rales.  Abdominal:     General: Bowel sounds are normal.     Palpations: Abdomen is soft.     Tenderness: There is no abdominal tenderness. There is no right CVA tenderness or left CVA tenderness.  Musculoskeletal:     Cervical back: Normal range of motion.  Lymphadenopathy:     Cervical: No cervical adenopathy.  Skin:    General: Skin is warm and dry.  Neurological:     General: No focal deficit present.     Mental Status: She is alert and oriented to person, place, and time.  Psychiatric:        Mood and Affect: Mood normal.        Behavior: Behavior normal.      UC Treatments / Results  Labs (all labs ordered are listed, but only abnormal results are displayed) Labs Reviewed  POCT URINALYSIS DIP (MANUAL ENTRY) - Abnormal; Notable for the following components:      Result Value   Clarity, UA cloudy (*)    Blood, UA small (*)    Leukocytes, UA Small (1+) (*)    All other components within normal limits  URINE CULTURE    EKG   Radiology No results found.  Procedures Procedures (including critical care time)  Medications Ordered in UC Medications - No data to display  Initial Impression / Assessment and Plan / UC Course  I have reviewed the triage vital signs and the nursing notes.  Pertinent labs & imaging results that were available during my care of the patient were reviewed by me and considered in my medical decision making (see chart for details).  Urinalysis is positive for small leukocytes and blood.  Given the patient's symptoms, will treat empirically with Macrobid 100 mg twice daily for the next 5 days pending the urine culture.  Patient was advised that if the culture result is negative, she will be contacted and advised to stop the antibiotic.  Supportive care recommendations were provided and discussed with the patient to include fluids, rest, avoiding caffeine, and developing a  toileting schedule.  Patient  was in agreement with this plan of care and verbalized understanding.  All questions were answered.  Patient stable for discharge.  Final Clinical Impressions(s) / UC Diagnoses   Final diagnoses:  UTI symptoms     Discharge Instructions      Urine culture is pending. You will be contacted once the results are received. You may be asked to stop the antibiotic if the culture result is negative. As discussed, if you are continuing to experience symptoms and your culture is negative, please follow-up with your PCP for further evaluation. Make sure you are drinking at least 8 to 10 eight ounce glasses of water daily. Develop a schedule that will allow you to urinate at least every 2 hours. Avoid caffeine such as tea, soda, and coffee while symptoms persist. Follow-up as needed.      ED Prescriptions     Medication Sig Dispense Auth. Provider   nitrofurantoin, macrocrystal-monohydrate, (MACROBID) 100 MG capsule Take 1 capsule (100 mg total) by mouth 2 (two) times daily. 10 capsule Leath-Warren, Sadie Haber, NP      PDMP not reviewed this encounter.   Abran Cantor, NP 03/30/23 1027

## 2023-03-30 NOTE — ED Triage Notes (Signed)
Pain on urination since last week.

## 2023-03-30 NOTE — Discharge Instructions (Addendum)
Urine culture is pending. You will be contacted once the results are received. You may be asked to stop the antibiotic if the culture result is negative. As discussed, if you are continuing to experience symptoms and your culture is negative, please follow-up with your PCP for further evaluation. Make sure you are drinking at least 8 to 10 eight ounce glasses of water daily. Develop a schedule that will allow you to urinate at least every 2 hours. Avoid caffeine such as tea, soda, and coffee while symptoms persist. Follow-up as needed.

## 2023-04-01 ENCOUNTER — Telehealth (HOSPITAL_COMMUNITY): Payer: Self-pay

## 2023-04-01 LAB — URINE CULTURE: Culture: 30000 — AB

## 2023-04-01 MED ORDER — SULFAMETHOXAZOLE-TRIMETHOPRIM 800-160 MG PO TABS: 1.0000 | ORAL_TABLET | Freq: Two times a day (BID) | ORAL | 0 refills | Status: AC

## 2023-04-01 NOTE — Telephone Encounter (Signed)
Per protocol, pt to dc Macrobid and begin treatment with Bactrim. Attempted to reach patient x1. LVM.  Rx sent to pharmacy on file.

## 2023-04-02 ENCOUNTER — Ambulatory Visit
Admission: RE | Admit: 2023-04-02 | Discharge: 2023-04-02 | Disposition: A | Discharge status: Home / Self Care | Payer: Medicaid Other | Source: Ambulatory Visit | Attending: Nurse Practitioner

## 2023-04-02 VITALS — BP 120/77 | HR 111 | Temp 99.1°F | Resp 20

## 2023-04-02 DIAGNOSIS — B349 Viral infection, unspecified: Secondary | ICD-10-CM | POA: Insufficient documentation

## 2023-04-02 DIAGNOSIS — J029 Acute pharyngitis, unspecified: Secondary | ICD-10-CM | POA: Insufficient documentation

## 2023-04-02 DIAGNOSIS — Z1152 Encounter for screening for COVID-19: Secondary | ICD-10-CM | POA: Diagnosis present

## 2023-04-02 LAB — POCT INFLUENZA A/B
Influenza A, POC: NEGATIVE
Influenza B, POC: NEGATIVE

## 2023-04-02 LAB — POCT RAPID STREP A (OFFICE): Rapid Strep A Screen: NEGATIVE

## 2023-04-02 MED ORDER — PROMETHAZINE-DM 6.25-15 MG/5ML PO SYRP: 5.0000 mL | ORAL_SOLUTION | Freq: Four times a day (QID) | ORAL | 0 refills | Status: DC | PRN

## 2023-04-02 NOTE — Discharge Instructions (Addendum)
The rapid strep test and influenza test were negative.  A throat culture and COVID test are pending.  You will be contacted if the pending test results are positive.  You also have access to the results via MyChart. Take medication as prescribed. Increase fluids and allow for plenty of rest. Recommend use of normal saline nasal spray throughout the day for nasal congestion and runny nose. Warm salt water gargles 3-4 times daily as needed for throat pain or discomfort. You may use a humidifier in your bedroom at nighttime during sleep and sleep elevated on pillows while cough symptoms persist. Symptoms should improve over the next 5 to 7 days.  If they appear to be worsening, or you have other concerns, you may follow-up in this clinic or with your primary care physician for further evaluation. Follow-up as needed.

## 2023-04-02 NOTE — ED Triage Notes (Signed)
Pt reports she has a headache, cold chills, throat pain, weak, and runny nose x 2 days

## 2023-04-02 NOTE — ED Provider Notes (Signed)
[facility]    CSN: [id number] Arrival date & time: 04/02/23  1033      History   Chief Complaint Chief Complaint  Patient presents with   Fever    I wanna get tested for flu and strep throat - Entered by patient    HPI Rebecca Adams is a 28 y.o. female.   The history is provided by the patient.   Patient presents for complaints of chills, fatigue, headache, throat pain, cough, and runny nose which has been present for the past 2 days.  Patient denies fever, ear pain, wheezing, difficulty breathing, chest pain, abdominal pain, nausea, vomiting, diarrhea, or rash.  Patient reports she has been taking over-the-counter analgesics for pain or discomfort.  She is currently taking Bactrim for a UTI.  Past Medical History:  Diagnosis Date   Ankle sprain 08/2015   Chlamydia 03/2016   Gonorrhea 04/2017   gonorrhea positive 2020 also   HSV-1 infection     Patient Active Problem List   Diagnosis Date Noted   Screening examination for STD (sexually transmitted disease) 02/04/2023   Intermenstrual bleeding 02/04/2023   Pregnancy examination or test, negative result 02/04/2023   Normal postpartum course 08/17/2019   SVD (6/15) 08/16/2019   Normal labor 08/15/2019    Past Surgical History:  Procedure Laterality Date   NO PAST SURGERIES      OB History     Gravida  2   Para  1   Term  1   Preterm  0   AB  1   Living  1      SAB  0   IAB  1   Ectopic  0   Multiple  0   Live Births  1            Home Medications    Prior to Admission medications   Medication Sig Start Date End Date Taking? Authorizing Provider  promethazine-dextromethorphan (PROMETHAZINE-DM) 6.25-15 MG/5ML syrup Take 5 mLs by mouth 4 (four) times daily as needed. 04/02/23  Yes Leath-Warren, Sadie Haber, NP  medroxyPROGESTERone (DEPO-PROVERA) 150 MG/ML injection Inject 1 mL (150 mg total) into the muscle every 3 (three) months. 01/12/23   Adline Potter, NP   nitrofurantoin, macrocrystal-monohydrate, (MACROBID) 100 MG capsule Take 1 capsule (100 mg total) by mouth 2 (two) times daily. 03/30/23   Leath-Warren, Sadie Haber, NP  sulfamethoxazole-trimethoprim (BACTRIM DS) 800-160 MG tablet Take 1 tablet by mouth 2 (two) times daily for 7 days. 04/01/23 04/08/23  Zenia Resides, MD    Family History Family History  Problem Relation Age of Onset   Breast cancer Paternal Grandmother    Hypertension Mother     Social History Social History   Tobacco Use   Smoking status: Every Day    Current packs/day: 2.00    Types: Cigarettes   Smokeless tobacco: Never  Vaping Use   Vaping status: Never Used  Substance Use Topics   Alcohol use: Yes    Alcohol/week: 1.0 standard drink of alcohol    Types: 1 Glasses of wine per week    Comment: every 2 weeks   Drug use: Not Currently    Types: Marijuana     Allergies   Patient has no known allergies.   Review of Systems Review of Systems Per HPI  Physical Exam Triage Vital Signs ED Triage Vitals  Encounter Vitals Group     BP 04/02/23 1059 120/77     Systolic BP Percentile --  Diastolic BP Percentile --      Pulse Rate 04/02/23 1059 (!) 111     Resp 04/02/23 1059 20     Temp 04/02/23 1059 99.1 F (37.3 C)     Temp Source 04/02/23 1059 Oral     SpO2 04/02/23 1059 97 %     Weight --      Height --      Head Circumference --      Peak Flow --      Pain Score 04/02/23 1100 6     Pain Loc --      Pain Education --      Exclude from Growth Chart --    No data found.  Updated Vital Signs BP 120/77 (BP Location: Right Arm)   Pulse (!) 111   Temp 99.1 F (37.3 C) (Oral)   Resp 20   SpO2 97%   Visual Acuity Right Eye Distance:   Left Eye Distance:   Bilateral Distance:    Right Eye Near:   Left Eye Near:    Bilateral Near:     Physical Exam Vitals and nursing note reviewed.  Constitutional:      General: She is not in acute distress.    Appearance: Normal appearance.   HENT:     Head: Normocephalic.     Right Ear: Tympanic membrane, ear canal and external ear normal.     Left Ear: Tympanic membrane, ear canal and external ear normal.     Nose: Congestion present.     Right Turbinates: Enlarged and swollen.     Left Turbinates: Enlarged and swollen.     Right Sinus: No maxillary sinus tenderness or frontal sinus tenderness.     Left Sinus: No maxillary sinus tenderness or frontal sinus tenderness.     Mouth/Throat:     Lips: Pink.     Mouth: Mucous membranes are moist.     Pharynx: Uvula midline. Pharyngeal swelling, posterior oropharyngeal erythema and postnasal drip present. No oropharyngeal exudate or uvula swelling.     Tonsils: 1+ on the right. 1+ on the left.  Eyes:     Extraocular Movements: Extraocular movements intact.     Conjunctiva/sclera: Conjunctivae normal.     Pupils: Pupils are equal, round, and reactive to light.  Cardiovascular:     Rate and Rhythm: Regular rhythm. Tachycardia present.     Pulses: Normal pulses.     Heart sounds: Normal heart sounds.  Pulmonary:     Effort: Pulmonary effort is normal. No respiratory distress.     Breath sounds: Normal breath sounds. No stridor. No wheezing, rhonchi or rales.  Abdominal:     General: Bowel sounds are normal.     Palpations: Abdomen is soft.     Tenderness: There is no abdominal tenderness.  Musculoskeletal:     Cervical back: Normal range of motion.  Lymphadenopathy:     Cervical: No cervical adenopathy.  Skin:    General: Skin is warm and dry.  Neurological:     General: No focal deficit present.     Mental Status: She is alert and oriented to person, place, and time.  Psychiatric:        Mood and Affect: Mood normal.        Behavior: Behavior normal.      UC Treatments / Results  Labs (all labs ordered are listed, but only abnormal results are displayed) Labs Reviewed  POCT RAPID STREP A (OFFICE) - Normal  POCT INFLUENZA A/B -  Normal  CULTURE, GROUP A STREP  ([facility])  SARS CORONAVIRUS 2 (TAT 6-24 HRS)    EKG   Radiology No results found.  Procedures Procedures (including critical care time)  Medications Ordered in UC Medications - No data to display  Initial Impression / Assessment and Plan / UC Course  I have reviewed the triage vital signs and the nursing notes.  Pertinent labs & imaging results that were available during my care of the patient were reviewed by me and considered in my medical decision making (see chart for details).  The strep test and influenza test were negative.  Throat culture and COVID test are pending.  Symptoms consistent with viral URI with cough.  Will provide symptomatic treatment for patient's cough with Promethazine DM.  Supportive care recommendations were provided and discussed with the patient to include fluids, rest, warm salt water gargles, and use of normal saline nasal spray for nasal congestion and runny nose.  Also recommended a soft diet while symptoms persist.  Discussed indications with the patient regarding follow-up.  Patient was in agreement with this plan of care and verbalizes understanding.  All questions were answered.  Patient stable for discharge.  Work note was provided.  Final Clinical Impressions(s) / UC Diagnoses   Final diagnoses:  Viral illness  Sore throat  Encounter for screening for COVID-19     Discharge Instructions      The rapid strep test and influenza test were negative.  A throat culture and COVID test are pending.  You will be contacted if the pending test results are positive.  You also have access to the results via MyChart. Take medication as prescribed. Increase fluids and allow for plenty of rest. Recommend use of normal saline nasal spray throughout the day for nasal congestion and runny nose. Warm salt water gargles 3-4 times daily as needed for throat pain or discomfort. You may use a humidifier in your bedroom at nighttime during sleep and sleep elevated  on pillows while cough symptoms persist. Symptoms should improve over the next 5 to 7 days.  If they appear to be worsening, or you have other concerns, you may follow-up in this clinic or with your primary care physician for further evaluation. Follow-up as needed.     ED Prescriptions     Medication Sig Dispense Auth. Provider   promethazine-dextromethorphan (PROMETHAZINE-DM) 6.25-15 MG/5ML syrup Take 5 mLs by mouth 4 (four) times daily as needed. 118 mL Leath-Warren, Sadie Haber, NP      PDMP not reviewed this encounter.   Abran Cantor, NP 04/02/23 1130

## 2023-04-03 LAB — SARS CORONAVIRUS 2 (TAT 6-24 HRS): SARS Coronavirus 2: NEGATIVE

## 2023-04-05 LAB — CULTURE, GROUP A STREP (THRC)

## 2023-04-06 ENCOUNTER — Ambulatory Visit: Payer: Medicaid Other

## 2023-04-08 ENCOUNTER — Ambulatory Visit (INDEPENDENT_AMBULATORY_CARE_PROVIDER_SITE_OTHER): Payer: Medicaid Other | Admitting: *Deleted

## 2023-04-08 DIAGNOSIS — Z3042 Encounter for surveillance of injectable contraceptive: Secondary | ICD-10-CM | POA: Diagnosis not present

## 2023-04-08 MED ORDER — MEDROXYPROGESTERONE ACETATE 150 MG/ML IM SUSP
150.0000 mg | Freq: Once | INTRAMUSCULAR | Status: AC
  Administered 2023-04-08: 150 mg via INTRAMUSCULAR

## 2023-04-08 NOTE — Progress Notes (Signed)
   NURSE VISIT- INJECTION  SUBJECTIVE:  Rebecca Adams is a 28 y.o. G106P1011 female here for a Depo Provera  for contraception/period management. She is a GYN patient.   OBJECTIVE:  There were no vitals taken for this visit.  Appears well, in no apparent distress  Injection administered in: Right upper quad. gluteus  Meds ordered this encounter  Medications   medroxyPROGESTERone  (DEPO-PROVERA ) injection 150 mg    ASSESSMENT: GYN patient Depo Provera  for contraception/period management PLAN: Follow-up: in 11-13 weeks for next Depo   Rutherford Rover  04/08/2023 11:14 AM

## 2023-04-16 IMAGING — DX DG HAND COMPLETE 3+V*L*
3 series · 3 of 3 positions shown · non-contrast
Comparison: None.

CLINICAL DATA: Acute LEFT hand pain following injury. Initial
encounter.

EXAM:
LEFT HAND - COMPLETE 3+ VIEW

[hand ap]
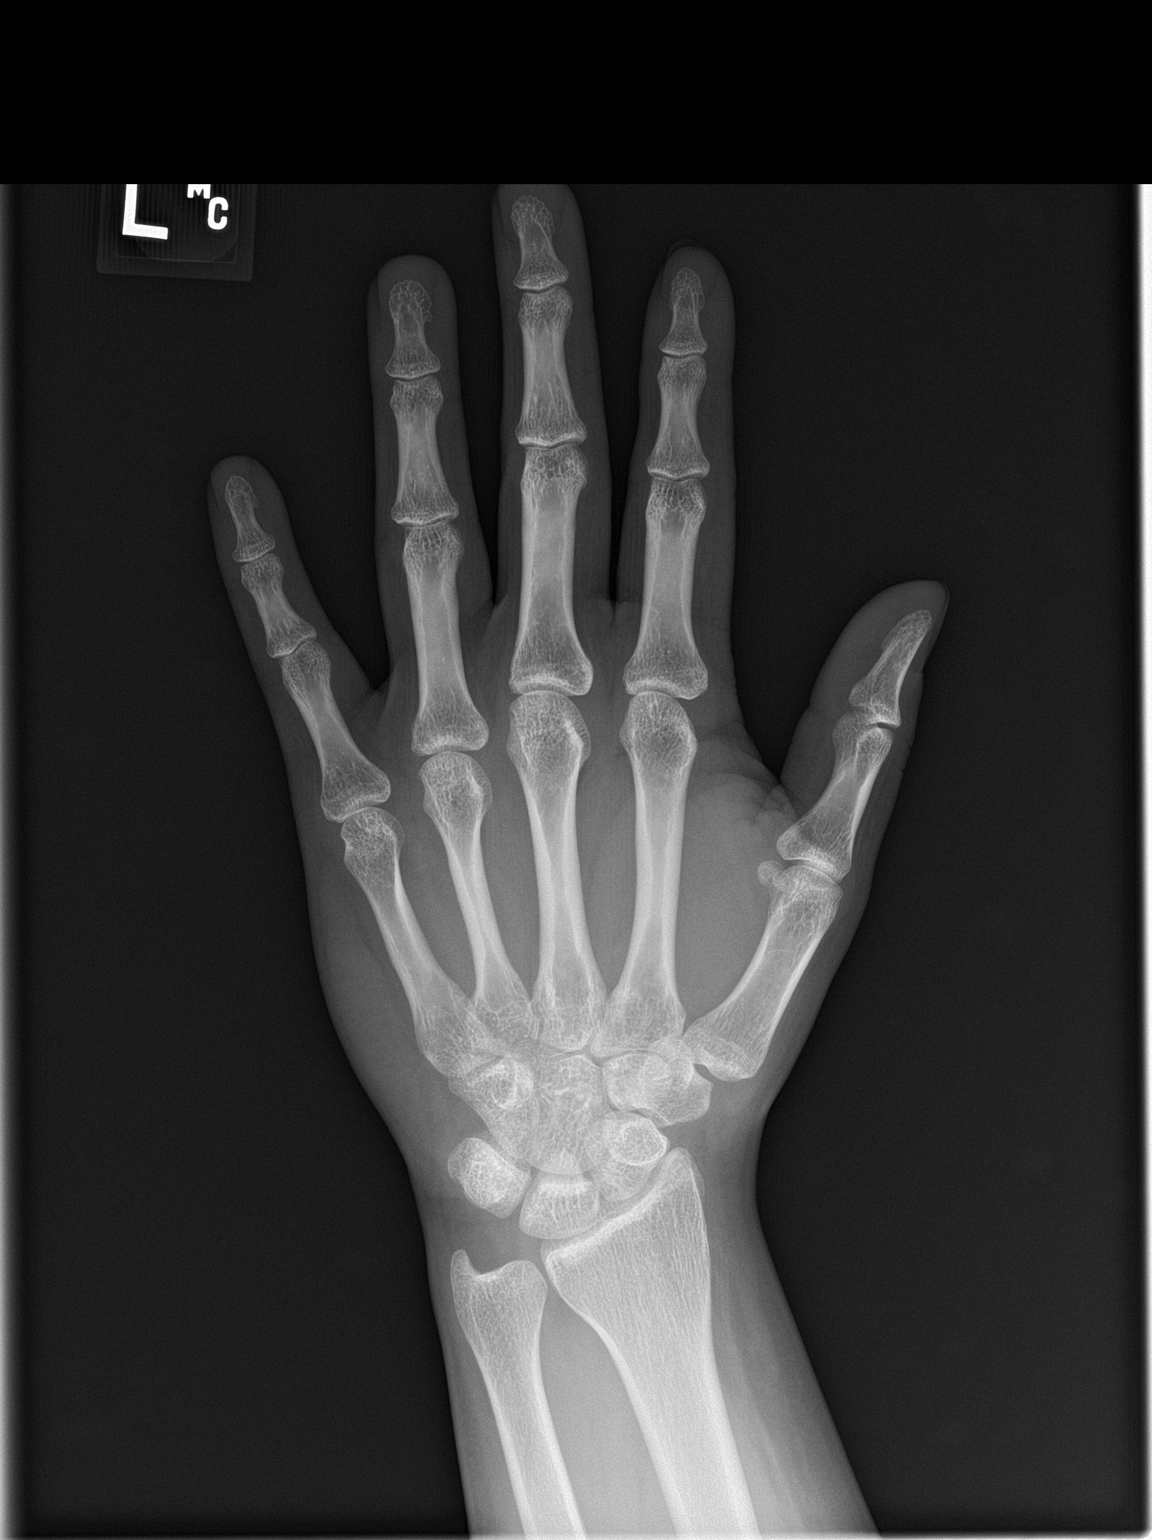

[hand obl]
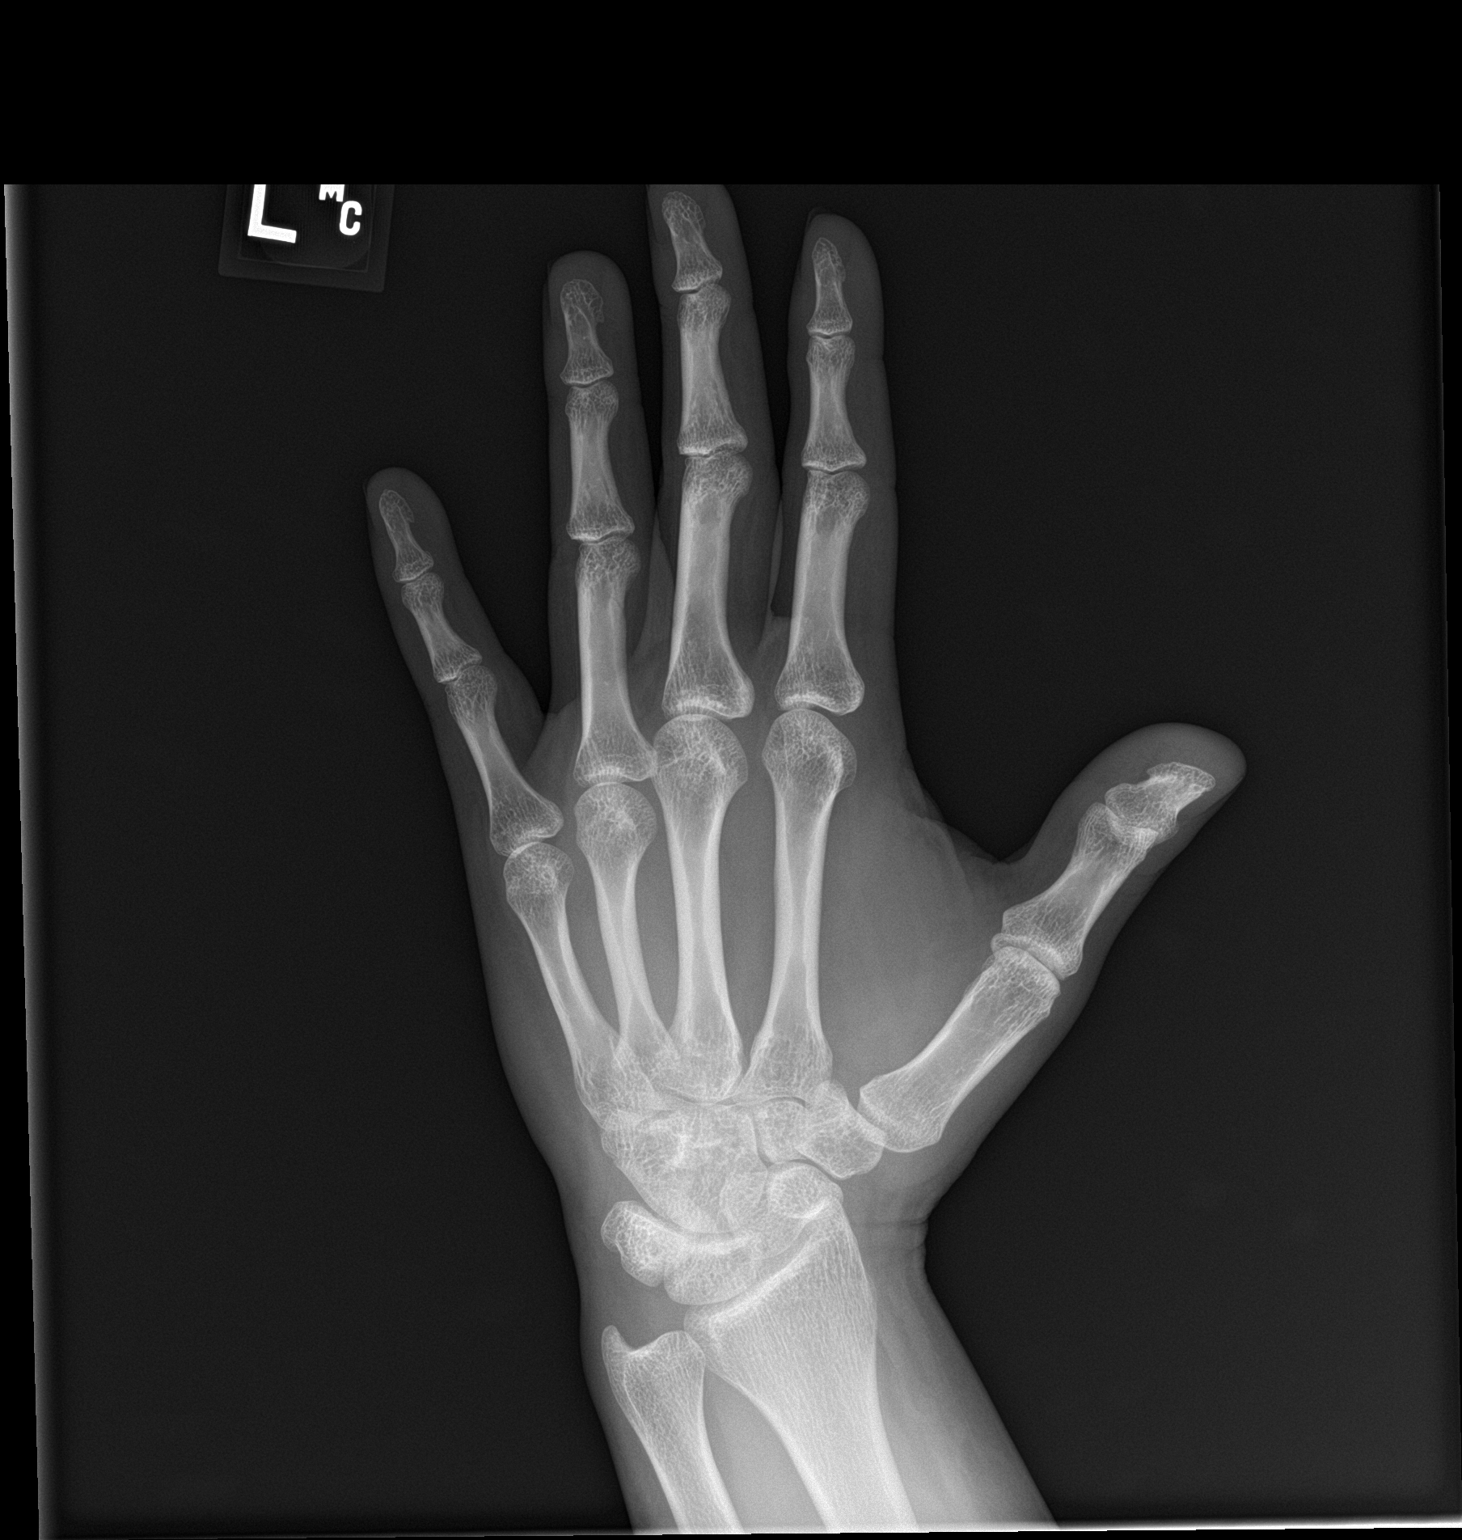

[hand lat]
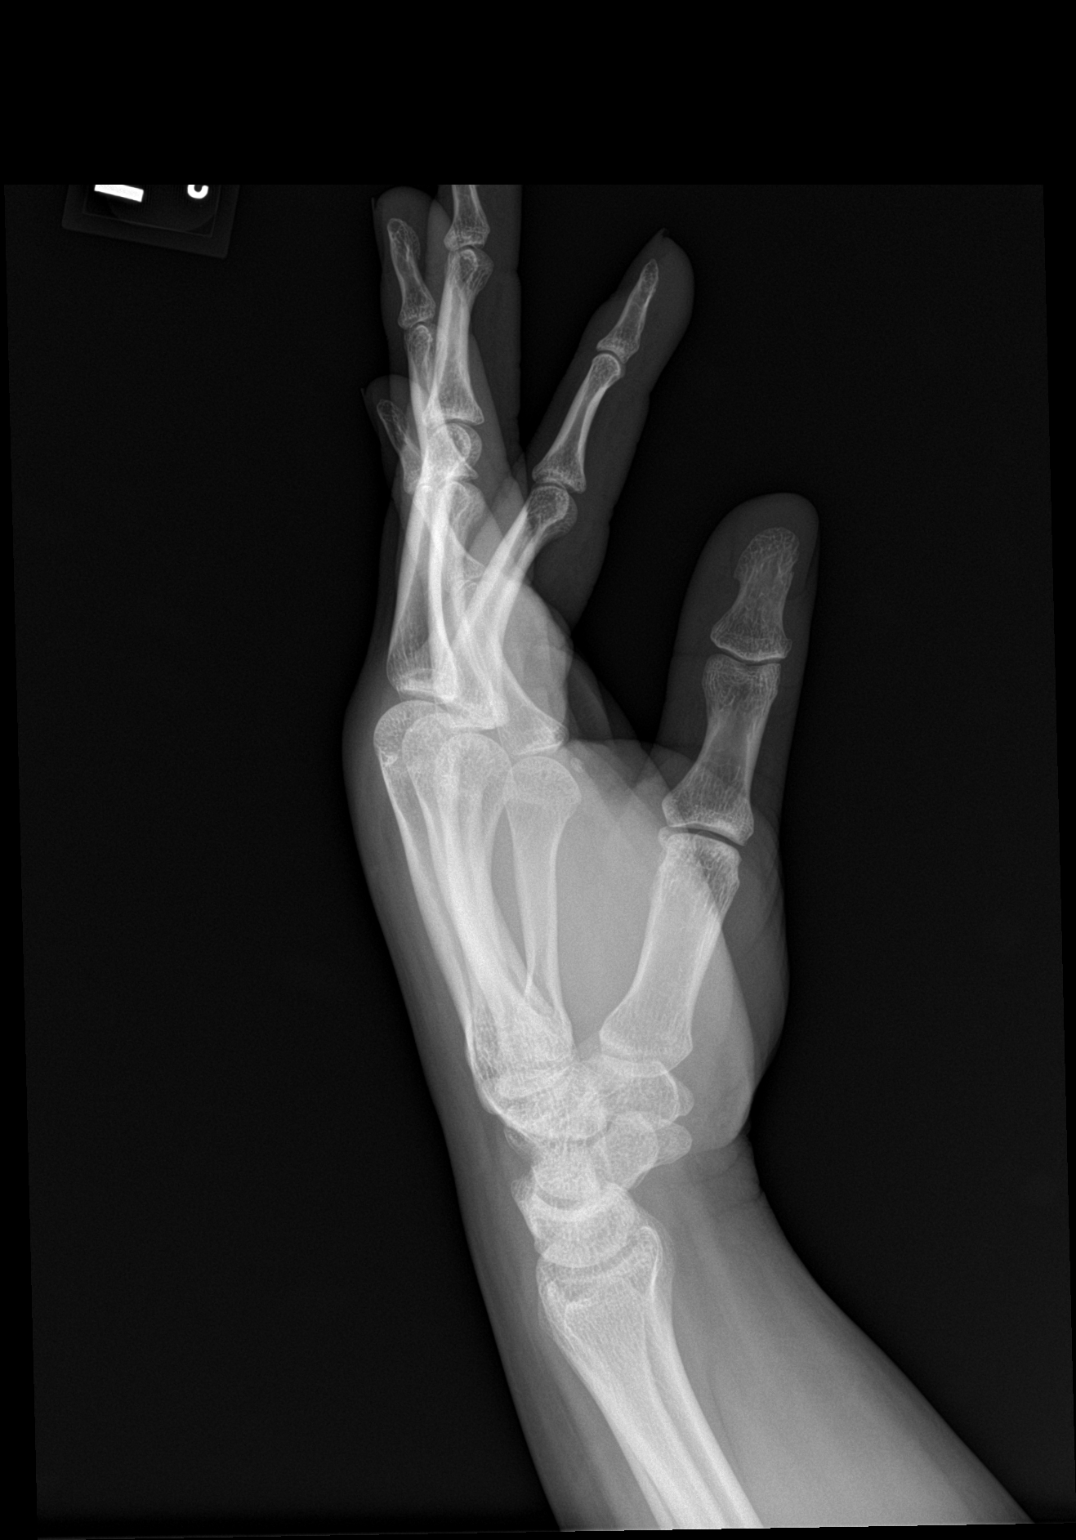

[3 of 3 positions shown; findings below may reference images not displayed]

FINDINGS: No acute fracture, subluxation or dislocation.

The joint spaces are unremarkable.

Soft tissue swelling is present.
IMPRESSION: Soft tissue swelling without acute bony abnormality.

## 2023-05-21 ENCOUNTER — Ambulatory Visit

## 2023-05-25 ENCOUNTER — Encounter: Payer: Self-pay | Admitting: Adult Health

## 2023-05-25 ENCOUNTER — Ambulatory Visit: Admitting: Adult Health

## 2023-05-25 ENCOUNTER — Other Ambulatory Visit (HOSPITAL_COMMUNITY)
Admission: RE | Admit: 2023-05-25 | Discharge: 2023-05-25 | Disposition: A | Discharge status: Home / Self Care | Source: Ambulatory Visit | Attending: Adult Health | Admitting: Adult Health

## 2023-05-25 VITALS — BP 121/86 | HR 99 | Ht 64.0 in | Wt 234.5 lb

## 2023-05-25 DIAGNOSIS — Z713 Dietary counseling and surveillance: Secondary | ICD-10-CM

## 2023-05-25 DIAGNOSIS — Z113 Encounter for screening for infections with a predominantly sexual mode of transmission: Secondary | ICD-10-CM

## 2023-05-25 DIAGNOSIS — Z6841 Body Mass Index (BMI) 40.0 and over, adult: Secondary | ICD-10-CM | POA: Diagnosis not present

## 2023-05-25 MED ORDER — PHENTERMINE HCL 37.5 MG PO TABS: 37.5000 mg | ORAL_TABLET | Freq: Every day | ORAL | 0 refills | Status: DC

## 2023-05-25 NOTE — Progress Notes (Signed)
  Subjective:     Patient ID: Rebecca Adams, female   DOB: 1995-04-13, 28 y.o.   MRN: [id number]  HPI Rebecca Adams is a 28 year old black female,single G2P1011, in wanting help losing weight and wants STD testing. She wants phentermine.     Component Value Date/Time   DIAGPAP  01/12/2023 1629    - Negative for intraepithelial lesion or malignancy (NILM)   DIAGPAP  03/17/2018 0000    NEGATIVE FOR INTRAEPITHELIAL LESIONS OR MALIGNANCY.   HPVHIGH Negative 01/12/2023 1629   ADEQPAP  01/12/2023 1629    Satisfactory for evaluation; transformation zone component ABSENT.   ADEQPAP  03/17/2018 0000    Satisfactory for evaluation  endocervical/transformation zone component PRESENT.     Review of Systems Hard time losing weight Not currently having sex Reviewed past medical,surgical, social and family history. Reviewed medications and allergies.     Objective:   Physical Exam BP 121/86 (BP Location: Left Arm, Patient Position: Sitting, Cuff Size: Large)   Pulse 99   Ht 5\' 4"  (1.626 m)   Wt 234 lb 8 oz (106.4 kg)   BMI 40.25 kg/m     Skin warm and dry.  Lungs: clear to ausculation bilaterally. Cardiovascular: regular rate and rhythm. Pt performed self swab. Fall risk is low  Upstream - 05/25/23 1028       Pregnancy Intention Screening   Does the patient want to become pregnant in the next year? No    Does the patient's partner want to become pregnant in the next year? No    Would the patient like to discuss contraceptive options today? No      Contraception Wrap Up   Current Method Hormonal Injection    End Method Hormonal Injection    Contraception Counseling Provided Yes             Assessment:     1. Screening for STD (sexually transmitted disease) CV swab sent for GC/CHL,trich,BV and yeast  - Cervicovaginal ancillary only( [facility])  2. Encounter for weight loss counseling (Primary) Discussed walking 30-60 minutes 5 out of 7 days Decrease carbs and sweets, bread,  potatoes,pasta and rice, cakes cookies and sodas Increase water Will rx phentermine 37.5 mg 1 daily  Meds ordered this encounter  Medications   phentermine (ADIPEX-P) 37.5 MG tablet    Sig: Take 1 tablet (37.5 mg total) by mouth daily before breakfast. Take 1 daily    Dispense:  30 tablet    Refill:  0    Supervising Provider:   Despina Hidden, LUTHER H [[id number]]     3. Body mass index 40.0-44.9, adult (HCC)     Plan:     Follow up in 4 weeks for weight and BP check

## 2023-05-26 ENCOUNTER — Other Ambulatory Visit: Payer: Self-pay | Admitting: Adult Health

## 2023-05-26 LAB — CERVICOVAGINAL ANCILLARY ONLY
Bacterial Vaginitis (gardnerella): POSITIVE — AB
Candida Glabrata: NEGATIVE
Candida Vaginitis: NEGATIVE
Chlamydia: NEGATIVE
Comment: NEGATIVE
Comment: NEGATIVE
Comment: NEGATIVE
Comment: NEGATIVE
Comment: NEGATIVE
Comment: NORMAL
Neisseria Gonorrhea: NEGATIVE
Trichomonas: NEGATIVE

## 2023-05-26 MED ORDER — METRONIDAZOLE 500 MG PO TABS: 500.0000 mg | ORAL_TABLET | Freq: Two times a day (BID) | ORAL | 0 refills | Status: DC

## 2023-05-26 NOTE — Progress Notes (Signed)
 +  BV on vaginal swab, will rx flagyl, no sex or alcohol while taking

## 2023-06-22 ENCOUNTER — Ambulatory Visit: Admitting: Adult Health

## 2023-06-22 ENCOUNTER — Encounter: Payer: Self-pay | Admitting: Adult Health

## 2023-06-22 VITALS — BP 117/81 | HR 99 | Ht 64.0 in | Wt 225.5 lb

## 2023-06-22 DIAGNOSIS — Z6838 Body mass index (BMI) 38.0-38.9, adult: Secondary | ICD-10-CM

## 2023-06-22 DIAGNOSIS — Z713 Dietary counseling and surveillance: Secondary | ICD-10-CM

## 2023-06-22 MED ORDER — PHENTERMINE HCL 37.5 MG PO TABS: 37.5000 mg | ORAL_TABLET | Freq: Every day | ORAL | 0 refills | Status: DC

## 2023-06-22 NOTE — Progress Notes (Signed)
  Subjective:     Patient ID: Rebecca Adams, female   DOB: 03/14/1995, 28 y.o.   MRN: [id number]  HPI Rebecca Adams is a 28 year old black female,single G2P1011, in for a weight and BP check after starting phentermine  in March, and has lost 9 lbs.      Component Value Date/Time   DIAGPAP  01/12/2023 1629    - Negative for intraepithelial lesion or malignancy (NILM)   DIAGPAP  03/17/2018 0000    NEGATIVE FOR INTRAEPITHELIAL LESIONS OR MALIGNANCY.   HPVHIGH Negative 01/12/2023 1629   ADEQPAP  01/12/2023 1629    Satisfactory for evaluation; transformation zone component ABSENT.   ADEQPAP  03/17/2018 0000    Satisfactory for evaluation  endocervical/transformation zone component PRESENT.    Review of Systems +weight loss Denies any headaches or trouble sleeping Reviewed past medical,surgical, social and family history. Reviewed medications and allergies.     Objective:   Physical Exam BP 117/81 (BP Location: Left Arm, Patient Position: Sitting, Cuff Size: Large)   Pulse 99   Ht 5\' 4"  (1.626 m)   Wt 225 lb 8 oz (102.3 kg)   LMP 06/21/2023 (Exact Date)   BMI 38.71 kg/m  has lost 9 lbs    Skin warm and dry.Lungs: clear to ausculation bilaterally. Cardiovascular: regular rate and rhythm.   Upstream - 06/22/23 0857       Pregnancy Intention Screening   Does the patient want to become pregnant in the next year? No    Does the patient's partner want to become pregnant in the next year? No    Would the patient like to discuss contraceptive options today? No      Contraception Wrap Up   Current Method Hormonal Injection    End Method Hormonal Injection    Contraception Counseling Provided Yes             Assessment:     1. Encounter for weight loss counseling (Primary) Try to walk more Will refill phentermine  Meds ordered this encounter  Medications   phentermine  (ADIPEX-P ) 37.5 MG tablet    Sig: Take 1 tablet (37.5 mg total) by mouth daily before breakfast. Take 1 daily     Dispense:  30 tablet    Refill:  0    Supervising Provider:   Evalyn Hillier H [[id number]]     2. Body mass index 38.0-38.9, adult     Plan:     Follow up in 4 weeks for weight and BP check

## 2023-07-01 ENCOUNTER — Other Ambulatory Visit (HOSPITAL_COMMUNITY)
Admission: RE | Admit: 2023-07-01 | Discharge: 2023-07-01 | Disposition: A | Discharge status: Home / Self Care | Source: Ambulatory Visit | Attending: Obstetrics & Gynecology | Admitting: Obstetrics & Gynecology

## 2023-07-01 ENCOUNTER — Ambulatory Visit: Payer: Medicaid Other | Admitting: *Deleted

## 2023-07-01 DIAGNOSIS — N939 Abnormal uterine and vaginal bleeding, unspecified: Secondary | ICD-10-CM

## 2023-07-01 DIAGNOSIS — Z113 Encounter for screening for infections with a predominantly sexual mode of transmission: Secondary | ICD-10-CM | POA: Insufficient documentation

## 2023-07-01 DIAGNOSIS — Z3042 Encounter for surveillance of injectable contraceptive: Secondary | ICD-10-CM | POA: Diagnosis not present

## 2023-07-01 MED ORDER — MEDROXYPROGESTERONE ACETATE 150 MG/ML IM SUSP
150.0000 mg | Freq: Once | INTRAMUSCULAR | Status: AC
Start: 2023-07-01 — End: 2023-07-01
  Administered 2023-07-01: 150 mg via INTRAMUSCULAR

## 2023-07-01 NOTE — Progress Notes (Unsigned)
   NURSE VISIT- VAGINITIS/STD/Injection  SUBJECTIVE:  Rebecca Adams is a 28 y.o. G2P1011 GYN patientfemale here for a vaginal swab for Vaginitis/STD screen and depo injection.  She reports the following symptoms: vaginal spotting for 1 day. Normally does not have spotting while on Depo. Denies significant pelvic pain, fever, or UTI symptoms.  OBJECTIVE:  LMP 06/21/2023 (Exact Date)   Appears well, in no apparent distress  ASSESSMENT: Vaginal swab for STD screen Depo Provera  today in Left VG  PLAN: Self-collected vaginal probe for Gonorrhea, Chlamydia, Trichomonas, Bacterial Vaginosis, Yeast sent to lab Treatment: to be determined once results are received Follow-up as needed if symptoms persist/worsen, or new symptoms develop 12 weeks for next Depo  Laverne Potter  07/01/2023 11:08 AM

## 2023-07-02 LAB — CERVICOVAGINAL ANCILLARY ONLY
Bacterial Vaginitis (gardnerella): POSITIVE — AB
Candida Glabrata: NEGATIVE
Candida Vaginitis: NEGATIVE
Chlamydia: NEGATIVE
Comment: NEGATIVE
Comment: NEGATIVE
Comment: NEGATIVE
Comment: NEGATIVE
Comment: NEGATIVE
Comment: NORMAL
Neisseria Gonorrhea: NEGATIVE
Trichomonas: NEGATIVE

## 2023-07-05 ENCOUNTER — Other Ambulatory Visit: Payer: Self-pay | Admitting: Adult Health

## 2023-07-05 MED ORDER — METRONIDAZOLE 500 MG PO TABS: 500.0000 mg | ORAL_TABLET | Freq: Two times a day (BID) | ORAL | 0 refills | Status: DC

## 2023-07-05 NOTE — Progress Notes (Signed)
 +  BV on vaginal swab, will rx flagyl, no sex or alcohol while taking

## 2023-07-20 ENCOUNTER — Ambulatory Visit: Admitting: Adult Health

## 2023-07-21 ENCOUNTER — Encounter: Payer: Self-pay | Admitting: Adult Health

## 2023-07-21 ENCOUNTER — Ambulatory Visit: Admitting: Adult Health

## 2023-07-21 VITALS — BP 118/82 | HR 108 | Ht 64.0 in | Wt 220.0 lb

## 2023-07-21 DIAGNOSIS — N760 Acute vaginitis: Secondary | ICD-10-CM

## 2023-07-21 DIAGNOSIS — Z713 Dietary counseling and surveillance: Secondary | ICD-10-CM

## 2023-07-21 DIAGNOSIS — Z6837 Body mass index (BMI) 37.0-37.9, adult: Secondary | ICD-10-CM | POA: Diagnosis not present

## 2023-07-21 DIAGNOSIS — B9689 Other specified bacterial agents as the cause of diseases classified elsewhere: Secondary | ICD-10-CM | POA: Diagnosis not present

## 2023-07-21 MED ORDER — PHENTERMINE HCL 37.5 MG PO TABS: 37.5000 mg | ORAL_TABLET | Freq: Every day | ORAL | 0 refills | Status: AC

## 2023-07-21 MED ORDER — METRONIDAZOLE 500 MG PO TABS: 500.0000 mg | ORAL_TABLET | Freq: Two times a day (BID) | ORAL | 0 refills | Status: AC

## 2023-07-21 NOTE — Progress Notes (Signed)
  Subjective:     Patient ID: Rebecca Adams, female   DOB: 09-27-1995, 28 y.o.   MRN: [id number]  HPI Rebecca Adams is a 28 year old black female,single, G2P1011, in for a weight and BP check, has been taking phentermine  for weight loss and has lost about 14 lbs since March. She did not get flagyl , after having +BV on vaginal swab 07/01/23, and wants to resent.     Component Value Date/Time   DIAGPAP  01/12/2023 1629    - Negative for intraepithelial lesion or malignancy (NILM)   DIAGPAP  03/17/2018 0000    NEGATIVE FOR INTRAEPITHELIAL LESIONS OR MALIGNANCY.   HPVHIGH Negative 01/12/2023 1629   ADEQPAP  01/12/2023 1629    Satisfactory for evaluation; transformation zone component ABSENT.   ADEQPAP  03/17/2018 0000    Satisfactory for evaluation  endocervical/transformation zone component PRESENT.    Review of Systems +weight loss Denies any headaches Reviewed past medical,surgical, social and family history. Reviewed medications and allergies.     Objective:   Physical Exam BP 118/82 (BP Location: Left Arm, Patient Position: Sitting, Cuff Size: Large)   Pulse (!) 108   Ht 5\' 4"  (1.626 m)   Wt 220 lb (99.8 kg)   LMP 06/21/2023 (Exact Date)   BMI 37.76 kg/m  down 5.5 lbs since April.    Skin warm and dry.  Lungs: clear to ausculation bilaterally. Cardiovascular: regular rate and rhythm.   Upstream - 07/21/23 0856       Pregnancy Intention Screening   Does the patient want to become pregnant in the next year? No    Does the patient's partner want to become pregnant in the next year? No    Would the patient like to discuss contraceptive options today? No      Contraception Wrap Up   Current Method Hormonal Injection    End Method Hormonal Injection    Contraception Counseling Provided Yes             Assessment:     1. Encounter for weight loss counseling (Primary) Continue phentermine , walk more  Meds ordered this encounter  Medications   metroNIDAZOLE  (FLAGYL ) 500 MG  tablet    Sig: Take 1 tablet (500 mg total) by mouth 2 (two) times daily.    Dispense:  14 tablet    Refill:  0    Supervising Provider:   Evalyn Hillier H [[id number]]   phentermine  (ADIPEX-P ) 37.5 MG tablet    Sig: Take 1 tablet (37.5 mg total) by mouth daily before breakfast. Take 1 daily    Dispense:  30 tablet    Refill:  0    Supervising Provider:   Evalyn Hillier H [[id number]]     2. Body mass index 37.0-37.9, adult  3. BV (bacterial vaginosis) Rx resent for flagyl      Plan:     Return about 08/17/23 for weight and BP check

## 2023-08-04 ENCOUNTER — Other Ambulatory Visit (HOSPITAL_COMMUNITY)
Admission: RE | Admit: 2023-08-04 | Discharge: 2023-08-04 | Disposition: A | Discharge status: Home / Self Care | Source: Ambulatory Visit | Attending: Obstetrics & Gynecology | Admitting: Obstetrics & Gynecology

## 2023-08-04 ENCOUNTER — Ambulatory Visit: Admitting: *Deleted

## 2023-08-04 DIAGNOSIS — B370 Candidal stomatitis: Secondary | ICD-10-CM

## 2023-08-04 DIAGNOSIS — N898 Other specified noninflammatory disorders of vagina: Secondary | ICD-10-CM | POA: Diagnosis present

## 2023-08-04 NOTE — Progress Notes (Signed)
   NURSE VISIT- VAGINITIS/STD/POC  SUBJECTIVE:  Rebecca Adams is a 28 y.o. G2P1011 GYN patientfemale here for a vaginal swab for vaginitis screening.  She reports the following symptoms: discharge described as brown and cramping for 1 week. Denies abnormal vaginal bleeding, significant pelvic pain, fever, or UTI symptoms.  OBJECTIVE:  There were no vitals taken for this visit.  Appears well, in no apparent distress  ASSESSMENT: Vaginal swab for vaginitis screening  PLAN: Self-collected vaginal probe for Gonorrhea, Chlamydia, Trichomonas, Bacterial Vaginosis, Yeast sent to lab Treatment: to be determined once results are received Follow-up as needed if symptoms persist/worsen, or new symptoms develop  Laverne Potter  08/04/2023 10:39 AM

## 2023-08-05 LAB — CERVICOVAGINAL ANCILLARY ONLY
Bacterial Vaginitis (gardnerella): NEGATIVE
Candida Glabrata: NEGATIVE
Candida Vaginitis: POSITIVE — AB
Chlamydia: NEGATIVE
Comment: NEGATIVE
Comment: NEGATIVE
Comment: NEGATIVE
Comment: NEGATIVE
Comment: NEGATIVE
Comment: NORMAL
Neisseria Gonorrhea: NEGATIVE
Trichomonas: NEGATIVE

## 2023-08-06 ENCOUNTER — Ambulatory Visit: Payer: Self-pay | Admitting: Adult Health

## 2023-08-06 MED ORDER — FLUCONAZOLE 150 MG PO TABS: ORAL_TABLET | ORAL | 1 refills | Status: DC

## 2023-08-24 ENCOUNTER — Ambulatory Visit: Admitting: Adult Health

## 2023-09-22 ENCOUNTER — Other Ambulatory Visit (HOSPITAL_COMMUNITY)
Admission: RE | Admit: 2023-09-22 | Discharge: 2023-09-22 | Disposition: A | Discharge status: Home / Self Care | Source: Ambulatory Visit | Attending: Obstetrics & Gynecology | Admitting: Obstetrics & Gynecology

## 2023-09-22 ENCOUNTER — Ambulatory Visit

## 2023-09-22 DIAGNOSIS — Z113 Encounter for screening for infections with a predominantly sexual mode of transmission: Secondary | ICD-10-CM | POA: Diagnosis present

## 2023-09-22 DIAGNOSIS — Z3042 Encounter for surveillance of injectable contraceptive: Secondary | ICD-10-CM

## 2023-09-22 MED ORDER — MEDROXYPROGESTERONE ACETATE 150 MG/ML IM SUSP
150.0000 mg | Freq: Once | INTRAMUSCULAR | Status: AC
  Administered 2023-09-22: 150 mg via INTRAMUSCULAR

## 2023-09-22 NOTE — Progress Notes (Signed)
   NURSE VISIT- INJECTION  SUBJECTIVE:  DAILEY ALBERSON is a 28 y.o. G61P1011 female here for a Depo Provera  for contraception/period management. She is a GYN patient.  Patient also wanted to do a self swab due to her trouble with BV OBJECTIVE:  There were no vitals taken for this visit.  Appears well, in no apparent distress  Injection administered in: Left upper quad. gluteus  Meds ordered this encounter  Medications   medroxyPROGESTERone  (DEPO-PROVERA ) injection 150 mg    ASSESSMENT: GYN patient Depo Provera  for contraception/period management Self swab performed and BV, Yeast, GC/Chlamydia tested for.  PLAN: Follow-up: in 11-13 weeks for next Depo  Provider will message you able results. Follow up as needed.  Aleck FORBES Blase  09/22/2023 9:44 AM

## 2023-09-23 ENCOUNTER — Ambulatory Visit: Payer: Self-pay | Admitting: Women's Health

## 2023-09-23 LAB — CERVICOVAGINAL ANCILLARY ONLY
Bacterial Vaginitis (gardnerella): NEGATIVE
Candida Glabrata: NEGATIVE
Candida Vaginitis: POSITIVE — AB
Chlamydia: NEGATIVE
Comment: NEGATIVE
Comment: NEGATIVE
Comment: NEGATIVE
Comment: NEGATIVE
Comment: NEGATIVE
Comment: NORMAL
Neisseria Gonorrhea: NEGATIVE
Trichomonas: NEGATIVE

## 2023-09-23 MED ORDER — FLUCONAZOLE 150 MG PO TABS: ORAL_TABLET | ORAL | 1 refills | Status: AC

## 2023-12-15 ENCOUNTER — Ambulatory Visit

## 2023-12-21 ENCOUNTER — Ambulatory Visit

## 2023-12-28 ENCOUNTER — Ambulatory Visit: Admitting: *Deleted

## 2023-12-28 ENCOUNTER — Other Ambulatory Visit (HOSPITAL_COMMUNITY)
Admission: RE | Admit: 2023-12-28 | Discharge: 2023-12-28 | Disposition: A | Discharge status: Home / Self Care | Source: Ambulatory Visit | Attending: Obstetrics & Gynecology | Admitting: Obstetrics & Gynecology

## 2023-12-28 DIAGNOSIS — Z3202 Encounter for pregnancy test, result negative: Secondary | ICD-10-CM

## 2023-12-28 DIAGNOSIS — Z113 Encounter for screening for infections with a predominantly sexual mode of transmission: Secondary | ICD-10-CM | POA: Insufficient documentation

## 2023-12-28 DIAGNOSIS — Z3042 Encounter for surveillance of injectable contraceptive: Secondary | ICD-10-CM

## 2023-12-28 LAB — POCT URINE PREGNANCY: Preg Test, Ur: NEGATIVE

## 2023-12-28 MED ORDER — MEDROXYPROGESTERONE ACETATE 150 MG/ML IM SUSP
150.0000 mg | Freq: Once | INTRAMUSCULAR | Status: AC
  Administered 2023-12-28: 150 mg via INTRAMUSCULAR

## 2023-12-28 NOTE — Progress Notes (Signed)
   NURSE VISIT- INJECTION  SUBJECTIVE:  Rebecca Adams is a 28 y.o. G47P1011 female here for a Depo Provera  for contraception/period management. She is a GYN patient.   OBJECTIVE:  There were no vitals taken for this visit.  Appears well, in no apparent distress  Injection administered in: Right upper quad. gluteus  Meds ordered this encounter  Medications   medroxyPROGESTERone  (DEPO-PROVERA ) injection 150 mg    ASSESSMENT: GYN patient Depo Provera  for contraception/period management PLAN: Follow-up: in 11-13 weeks for next Depo   Pt also requested to do a swab to test for everything just to make sure. Self swab collected for BV, yeast, trich, gc/ch.  Alan LITTIE Fischer  12/28/2023 3:58 PM

## 2023-12-31 ENCOUNTER — Ambulatory Visit: Payer: Self-pay

## 2023-12-31 LAB — CERVICOVAGINAL ANCILLARY ONLY
Bacterial Vaginitis (gardnerella): POSITIVE — AB
Candida Glabrata: NEGATIVE
Candida Vaginitis: NEGATIVE
Chlamydia: NEGATIVE
Comment: NEGATIVE
Comment: NEGATIVE
Comment: NEGATIVE
Comment: NEGATIVE
Comment: NEGATIVE
Comment: NORMAL
Neisseria Gonorrhea: NEGATIVE
Trichomonas: NEGATIVE

## 2023-12-31 MED ORDER — METRONIDAZOLE 0.75 % VA GEL: 1.0000 | Freq: Every day | VAGINAL | 0 refills | Status: AC

## 2024-03-14 ENCOUNTER — Other Ambulatory Visit: Payer: Self-pay | Admitting: Adult Health

## 2024-03-21 ENCOUNTER — Ambulatory Visit: Admitting: *Deleted

## 2024-03-21 ENCOUNTER — Other Ambulatory Visit (HOSPITAL_COMMUNITY)
Admission: RE | Admit: 2024-03-21 | Discharge: 2024-03-21 | Disposition: A | Discharge status: Home / Self Care | Source: Ambulatory Visit | Attending: Obstetrics & Gynecology | Admitting: Obstetrics & Gynecology

## 2024-03-21 DIAGNOSIS — Z113 Encounter for screening for infections with a predominantly sexual mode of transmission: Secondary | ICD-10-CM | POA: Insufficient documentation

## 2024-03-21 DIAGNOSIS — Z3042 Encounter for surveillance of injectable contraceptive: Secondary | ICD-10-CM

## 2024-03-21 MED ORDER — MEDROXYPROGESTERONE ACETATE 150 MG/ML IM SUSP
150.0000 mg | Freq: Once | INTRAMUSCULAR | Status: AC
  Administered 2024-03-21: 150 mg via INTRAMUSCULAR

## 2024-03-21 NOTE — Progress Notes (Signed)
" ° °  NURSE VISIT- STD/Injection  SUBJECTIVE:  Rebecca Adams is a 29 y.o. G2P1011 GYN patientfemale here for a vaginal swab for STD screen and Depo injection.  She reports the following symptoms: none Denies abnormal vaginal bleeding, significant pelvic pain, fever, or UTI symptoms.  OBJECTIVE:  There were no vitals taken for this visit.  Appears well, in no apparent distress  ASSESSMENT: Vaginal swab for STD screen Depo Provera  given in Left upper quad  PLAN: Self-collected vaginal probe for Gonorrhea, Chlamydia, Trichomonas sent to lab Treatment: to be determined once results are received Follow-up as needed if symptoms persist/worsen, or new symptoms develop 11-13 weeks for next depo injection  Rutherford Rover  03/21/2024 3:59 PM  "

## 2024-03-23 ENCOUNTER — Ambulatory Visit: Payer: Self-pay | Admitting: Adult Health

## 2024-03-23 LAB — CERVICOVAGINAL ANCILLARY ONLY
Chlamydia: NEGATIVE
Comment: NEGATIVE
Comment: NEGATIVE
Comment: NORMAL
Neisseria Gonorrhea: NEGATIVE
Trichomonas: NEGATIVE

## 2024-06-13 ENCOUNTER — Ambulatory Visit
